# Patient Record
Sex: Female | Born: 1990 | Race: Black or African American | Hispanic: No | Marital: Single | Smoking: Current some day smoker
Health system: Southern US, Community
[De-identification: ages and names within clinical notes are randomized; demographics above are authoritative.]

## PROBLEM LIST (undated history)

## (undated) ENCOUNTER — Ambulatory Visit (HOSPITAL_COMMUNITY): Admission: EM | Payer: Self-pay | Source: Home / Self Care

## (undated) DIAGNOSIS — F32A Depression, unspecified: Secondary | ICD-10-CM

## (undated) DIAGNOSIS — F329 Major depressive disorder, single episode, unspecified: Secondary | ICD-10-CM

---
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
## 2016-02-14 ENCOUNTER — Ambulatory Visit (HOSPITAL_COMMUNITY)
Admission: EM | Admit: 2016-02-14 | Discharge: 2016-02-14 | Disposition: A | Discharge status: Home / Self Care | Payer: Self-pay | Attending: Emergency Medicine | Admitting: Emergency Medicine

## 2016-02-14 ENCOUNTER — Encounter (HOSPITAL_COMMUNITY): Payer: Self-pay | Admitting: *Deleted

## 2016-02-14 DIAGNOSIS — Z4802 Encounter for removal of sutures: Secondary | ICD-10-CM

## 2016-02-14 NOTE — Discharge Instructions (Signed)
If you notice swelling, redness, drainage of pus, increased pain, red streaks, decreased function of your hand or thumb seek medical treatment promptly. May take ibuprofen for pain. Clean the wound with mild soap and water 3-4 times a day.
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
## 2016-02-14 NOTE — ED Triage Notes (Signed)
Patient had sutures placed to anterior aspect of right hand on the 29th. On exam no signs of infection noted, area is healing well. Patient reports the laceration to her hand was down to the bone. Patient with decreased sensation to anterior aspect of thumb, states that this occurred with injury. Spoke with patient regarding physical therapy excercises for her thumb and hand as she has decreased grip from no using and damage.

## 2016-02-14 NOTE — ED Notes (Signed)
6 sutures removed from hand, one suture removed from laceration to back. No signs of infection to back laceration, patient states that she had 3 sutures to the back (states she scratched 2 of them out). Patient tolerating procedure using easy spray to numb site.

## 2017-02-23 ENCOUNTER — Emergency Department (HOSPITAL_COMMUNITY)
Admission: EM | Admit: 2017-02-23 | Discharge: 2017-02-23 | Disposition: A | Discharge status: Home / Self Care | Payer: Self-pay | Attending: Emergency Medicine | Admitting: Emergency Medicine

## 2017-02-23 ENCOUNTER — Encounter (HOSPITAL_COMMUNITY): Payer: Self-pay | Admitting: *Deleted

## 2017-02-23 DIAGNOSIS — N898 Other specified noninflammatory disorders of vagina: Secondary | ICD-10-CM

## 2017-02-23 DIAGNOSIS — B9689 Other specified bacterial agents as the cause of diseases classified elsewhere: Secondary | ICD-10-CM | POA: Insufficient documentation

## 2017-02-23 DIAGNOSIS — N760 Acute vaginitis: Secondary | ICD-10-CM | POA: Insufficient documentation

## 2017-02-23 DIAGNOSIS — F17200 Nicotine dependence, unspecified, uncomplicated: Secondary | ICD-10-CM | POA: Insufficient documentation

## 2017-02-23 LAB — WET PREP, GENITAL
Clue Cells Wet Prep HPF POC: NONE SEEN
Sperm: NONE SEEN
Trich, Wet Prep: NONE SEEN
Yeast Wet Prep HPF POC: NONE SEEN

## 2017-02-23 MED ORDER — METRONIDAZOLE 500 MG PO TABS: 500.0000 mg | ORAL_TABLET | Freq: Two times a day (BID) | ORAL | 0 refills | Status: DC

## 2017-02-23 NOTE — ED Notes (Signed)
Declined W/C at D/C and was escorted to lobby by RN. 

## 2017-02-23 NOTE — ED Provider Notes (Signed)
[facility] EMERGENCY DEPARTMENT Provider Note   CSN: [id number] Arrival date & time: 02/23/17  0818     History   Chief Complaint Chief Complaint  Patient presents with  . Vaginal Discharge    HPI April Bean is a 26 y.o. female.  She presents for evaluation of a malodorous vaginal discharge, present for several days.  She tried an over-the-counter yeast preparation, without relief.  She has some upper abdominal cramping but no lower abdomen or back pain.  She denies fever, chills, nausea, vomiting, weakness or dizziness.  There are no other known modifying factors.  HPI  History reviewed. No pertinent past medical history.  There are no active problems to display for this patient.   History reviewed. No pertinent surgical history.  OB History    No data available       Home Medications    Prior to Admission medications   Medication Sig Start Date End Date Taking? Authorizing Provider  ibuprofen (ADVIL,MOTRIN) 800 MG tablet Take 800 mg by mouth every 8 (eight) hours as needed.    [provider]  metroNIDAZOLE (FLAGYL) 500 MG tablet Take 1 tablet (500 mg total) by mouth 2 (two) times daily. One po bid x 7 days 02/23/17   Mancel BaleWentz, Shamirah Ivan, MD    Family History No family history on file.  Social History Social History  Substance Use Topics  . Smoking status: Current Every Day Smoker  . Smokeless tobacco: Never Used  . Alcohol use Not on file     Allergies   Pertussis vaccines   Review of Systems Review of Systems  All other systems reviewed and are negative.    Physical Exam Updated Vital Signs BP (!) 148/88 (BP Location: Right Arm)   Pulse 90   Temp 98.1 F (36.7 C) (Oral)   Ht 5' 7.25" (1.708 m)   Wt 89.4 kg (197 lb)   LMP 01/25/2017   SpO2 100%   BMI 30.63 kg/m   Physical Exam  Constitutional: She is oriented to person, place, and time. She appears well-developed and well-nourished. No distress.  HENT:    Head: Normocephalic and atraumatic.  Eyes: Pupils are equal, round, and reactive to light. Conjunctivae and EOM are normal.  Neck: Normal range of motion and phonation normal. Neck supple.  Cardiovascular: Normal rate.   Pulmonary/Chest: Effort normal. She exhibits no tenderness.  Abdominal: Soft. She exhibits no distension. There is no tenderness. There is no guarding.  Genitourinary:  Genitourinary Comments: Normal external female genitalia.  Small amount of thin opaque white vaginal discharge.  No cervical motion tenderness.  No adnexal mass or palpable organomegaly, on bimanual examination.  Musculoskeletal: Normal range of motion.  Neurological: She is alert and oriented to person, place, and time. She exhibits normal muscle tone.  Skin: Skin is warm and dry.  Psychiatric: She has a normal mood and affect. Her behavior is normal. Judgment and thought content normal.  Nursing note and vitals reviewed.    ED Treatments / Results  Labs (all labs ordered are listed, but only abnormal results are displayed) Labs Reviewed  WET PREP, GENITAL - Abnormal; Notable for the following:       Result Value   WBC, Wet Prep HPF POC FEW (*)    All other components within normal limits  GC/CHLAMYDIA PROBE AMP ([place]) NOT AT [facility]    EKG  EKG Interpretation None       Radiology No results found.  Procedures Procedures (including  critical care time)  Medications Ordered in ED Medications - No data to display   Initial Impression / Assessment and Plan / ED Course  I have reviewed the triage vital signs and the nursing notes.  Pertinent labs & imaging results that were available during my care of the patient were reviewed by me and considered in my medical decision making (see chart for details).      No data found.   At discharge- reevaluation with update and discussion. After initial assessment and treatment, an updated evaluation reveals no further complaints.  Findings  discussed with the patient and all questions were answered. Kimmberly Wisser L     Final Clinical Impressions(s) / ED Diagnoses   Final diagnoses:  Vaginal discharge  BV (bacterial vaginosis)   Nonspecific malodorous vaginal discharge, likely nonspecific vaginitis.  Doubt cervicitis, PID, serious bacterial infection or metabolic instability.  Nursing Notes Reviewed/ Care Coordinated Applicable Imaging Reviewed Interpretation of Laboratory Data incorporated into ED treatment  The patient appears reasonably screened and/or stabilized for discharge and I doubt any other medical condition or other [facility] requiring further screening, evaluation, or treatment in the ED at this time prior to discharge.  Plan: Home Medications-OTC analgesia as needed, moisturizing cream for skin dryness.; Home Treatments-rest; return here if the recommended treatment, does not improve the symptoms; Recommended follow up-PCP, as needed   New Prescriptions Discharge Medication List as of 02/23/2017 11:04 AM    START taking these medications   Details  metroNIDAZOLE (FLAGYL) 500 MG tablet Take 1 tablet (500 mg total) by mouth 2 (two) times daily. One po bid x 7 days, Starting Fri 02/23/2017, Print         Mancel Bale, MD 02/24/17 [phone]

## 2017-02-23 NOTE — Discharge Instructions (Signed)
Follow-up with 1 of the listed clinics, if you are not better in 1 week.

## 2017-02-23 NOTE — ED Triage Notes (Signed)
Pt states at she has had yellow vaginal discharge for 1 week reports using OTC yeast infection cream with no relief.

## 2017-02-26 LAB — GC/CHLAMYDIA PROBE AMP (~~LOC~~) NOT AT ARMC
Chlamydia: NEGATIVE
Neisseria Gonorrhea: NEGATIVE

## 2017-06-11 ENCOUNTER — Encounter (HOSPITAL_COMMUNITY): Payer: Self-pay

## 2017-06-11 ENCOUNTER — Other Ambulatory Visit: Payer: Self-pay

## 2017-06-11 ENCOUNTER — Emergency Department (HOSPITAL_COMMUNITY)
Admission: EM | Admit: 2017-06-11 | Discharge: 2017-06-11 | Disposition: A | Discharge status: Home / Self Care | Payer: Self-pay | Attending: Emergency Medicine | Admitting: Emergency Medicine

## 2017-06-11 DIAGNOSIS — R51 Headache: Secondary | ICD-10-CM | POA: Insufficient documentation

## 2017-06-11 DIAGNOSIS — Z5321 Procedure and treatment not carried out due to patient leaving prior to being seen by health care provider: Secondary | ICD-10-CM | POA: Insufficient documentation

## 2017-06-11 DIAGNOSIS — N898 Other specified noninflammatory disorders of vagina: Secondary | ICD-10-CM | POA: Insufficient documentation

## 2017-06-11 LAB — POC URINE PREG, ED: PREG TEST UR: NEGATIVE

## 2017-06-11 NOTE — ED Triage Notes (Signed)
Pt presents to the ed with complaints of vaginal discharge x 4 days and a headache x 2 weeks, no neuro deficits present at triage.

## 2017-06-14 ENCOUNTER — Other Ambulatory Visit: Payer: Self-pay

## 2017-06-14 ENCOUNTER — Emergency Department (HOSPITAL_COMMUNITY)
Admission: EM | Admit: 2017-06-14 | Discharge: 2017-06-14 | Disposition: A | Discharge status: Home / Self Care | Payer: Self-pay | Attending: Emergency Medicine | Admitting: Emergency Medicine

## 2017-06-14 ENCOUNTER — Encounter (HOSPITAL_COMMUNITY): Payer: Self-pay | Admitting: Emergency Medicine

## 2017-06-14 DIAGNOSIS — F17200 Nicotine dependence, unspecified, uncomplicated: Secondary | ICD-10-CM | POA: Insufficient documentation

## 2017-06-14 DIAGNOSIS — B9689 Other specified bacterial agents as the cause of diseases classified elsewhere: Secondary | ICD-10-CM | POA: Insufficient documentation

## 2017-06-14 DIAGNOSIS — N760 Acute vaginitis: Secondary | ICD-10-CM | POA: Insufficient documentation

## 2017-06-14 LAB — URINALYSIS, ROUTINE W REFLEX MICROSCOPIC
Bilirubin Urine: NEGATIVE
Glucose, UA: NEGATIVE mg/dL
Hgb urine dipstick: NEGATIVE
KETONES UR: NEGATIVE mg/dL
LEUKOCYTES UA: NEGATIVE
NITRITE: NEGATIVE
PROTEIN: NEGATIVE mg/dL
Specific Gravity, Urine: 1.018 (ref 1.005–1.030)
pH: 6 (ref 5.0–8.0)

## 2017-06-14 LAB — WET PREP, GENITAL
Sperm: NONE SEEN
TRICH WET PREP: NONE SEEN
YEAST WET PREP: NONE SEEN

## 2017-06-14 LAB — I-STAT BETA HCG BLOOD, ED (MC, WL, AP ONLY): I-stat hCG, quantitative: <5 m[IU]/mL (ref <5)

## 2017-06-14 MED ORDER — METRONIDAZOLE 500 MG PO TABS: 500.0000 mg | ORAL_TABLET | Freq: Two times a day (BID) | ORAL | 0 refills | Status: DC

## 2017-06-14 NOTE — ED Triage Notes (Signed)
Pt states she is having some vaginal discharge that she wants to be check.

## 2017-06-14 NOTE — Discharge Instructions (Signed)
You have evidence of bacterial vaginosis on the wet prep.  Additional testing, including STD test, are still pending.  Abnormal results will be called to you. Please take all of your antibiotics until finished!   You may develop abdominal discomfort or diarrhea from the antibiotic.  You may help offset this with probiotics which you can buy or get in yogurt. Do not eat or take the probiotics until 2 hours after your antibiotic.  Follow-up with OB/GYN or primary care provider for any further instances of this issue.

## 2017-06-14 NOTE — ED Provider Notes (Signed)
[facility] EMERGENCY DEPARTMENT Provider Note   CSN: [id number] Arrival date & time: 06/14/17  2956     History   Chief Complaint Chief Complaint  Patient presents with  . Vaginal Discharge    HPI April Bean is a 27 y.o. female.  HPI   April Bean is a 27 y.o. female, patient with no pertinent past medical history, presenting to the ED with abnormal vaginal discharge for the past week. Yellow and malodorous. LMP 05/25/17. States she was trying to get pregnant last month. Sexually active with one female partner. Denies fever/chills, dysuria, hematuria, abnormal vaginal bleeding, abdominal pain, N/V/D, vaginal pain/irritation, or any other complaints.     No past medical history on file.  There are no active problems to display for this patient.   History reviewed. No pertinent surgical history.  OB History    No data available       Home Medications    Prior to Admission medications   Medication Sig Start Date End Date Taking? Authorizing Provider  acetaminophen (TYLENOL) 500 MG tablet Take 1,000-1,500 mg by mouth every 6 (six) hours as needed for headache (pain).    [provider]  ibuprofen (ADVIL,MOTRIN) 200 MG tablet Take 400-600 mg by mouth every 6 (six) hours as needed for headache (pain).    [provider]  metroNIDAZOLE (FLAGYL) 500 MG tablet Take 1 tablet (500 mg total) by mouth 2 (two) times daily. 06/14/17   Prentiss Polio, Hillard Danker, PA-C    Family History No family history on file.  Social History Social History   Tobacco Use  . Smoking status: Current Every Day Smoker  . Smokeless tobacco: Never Used  Substance Use Topics  . Alcohol use: Not on file  . Drug use: Not on file     Allergies   Pertussis vaccines   Review of Systems Review of Systems  Constitutional: Negative for chills and fever.  Respiratory: Negative for shortness of breath.   Cardiovascular: Negative for chest pain.  Gastrointestinal:  Negative for abdominal pain.  Genitourinary: Positive for vaginal discharge. Negative for dysuria, frequency, hematuria, vaginal bleeding and vaginal pain.  All other systems reviewed and are negative.    Physical Exam Updated Vital Signs BP (!) 130/106 (BP Location: Right Arm)   Pulse (!) 53   Temp 98 F (36.7 C) (Oral)   Resp 18   Ht 5\' 7"  (1.702 m)   Wt 93.4 kg (206 lb)   LMP 05/28/2017   SpO2 100%   BMI 32.26 kg/m   Physical Exam  Constitutional: She appears well-developed and well-nourished. No distress.  HENT:  Head: Normocephalic and atraumatic.  Eyes: Conjunctivae are normal.  Neck: Neck supple.  Cardiovascular: Normal rate, regular rhythm, normal heart sounds and intact distal pulses.  Pulmonary/Chest: Effort normal and breath sounds normal. No respiratory distress.  Abdominal: Soft. There is no tenderness. There is no guarding.  Genitourinary: Vaginal discharge found.  Genitourinary Comments: External genitalia normal Vagina with discharge - thin, white moderate discharge in vaginal vault. Cervix  normal negative for cervical motion tenderness Adnexa palpated, no masses, negative for tenderness noted Bladder palpated negative for tenderness Uterus palpated no masses, negative for tenderness  No inguinal lymphadenopathy. Otherwise normal female genitalia. RN, Maralyn Sago, served as chaperone during exam.  Musculoskeletal: She exhibits no edema.  Lymphadenopathy:    She has no cervical adenopathy.  Neurological: She is alert.  Skin: Skin is warm and dry. She is not diaphoretic.  Psychiatric: She has  a normal mood and affect. Her behavior is normal.  Nursing note and vitals reviewed.    ED Treatments / Results  Labs (all labs ordered are listed, but only abnormal results are displayed) Labs Reviewed  WET PREP, GENITAL - Abnormal; Notable for the following components:      Result Value   Clue Cells Wet Prep HPF POC PRESENT (*)    WBC, Wet Prep HPF POC MANY (*)     All other components within normal limits  URINALYSIS, ROUTINE W REFLEX MICROSCOPIC  RPR  HIV ANTIBODY (ROUTINE TESTING)  I-STAT BETA HCG BLOOD, ED ([facility], [facility], [facility] ONLY)  GC/CHLAMYDIA PROBE AMP ([place]) NOT AT [facility]    EKG  EKG Interpretation None       Radiology No results found.  Procedures Pelvic exam Date/Time: 06/14/2017 9:30 AM Performed by: Anselm PancoastJoy, Fredderick Swanger C, PA-C Authorized by: Anselm PancoastJoy, Christophere Hillhouse C, PA-C  Consent: Verbal consent obtained. Risks and benefits: risks, benefits and alternatives were discussed Consent given by: patient Patient identity confirmed: verbally with patient and provided demographic data Local anesthesia used: no  Anesthesia: Local anesthesia used: no  Sedation: Patient sedated: no  Patient tolerance: Patient tolerated the procedure well with no immediate complications    (including critical care time)  Medications Ordered in ED Medications - No data to display   Initial Impression / Assessment and Plan / ED Course  I have reviewed the triage vital signs and the nursing notes.  Pertinent labs & imaging results that were available during my care of the patient were reviewed by me and considered in my medical decision making (see chart for details).     Patient presents with vaginal discharge without irritation or pain.  Low suspicion for PID.  Wet prep indicates BV.  Treatment initiated.  Advised OB/GYN follow-up for any further management. The patient was given instructions for home care as well as return precautions. Patient voices understanding of these instructions, accepts the plan, and is comfortable with discharge.    Final Clinical Impressions(s) / ED Diagnoses   Final diagnoses:  BV (bacterial vaginosis)    ED Discharge Orders        Ordered    metroNIDAZOLE (FLAGYL) 500 MG tablet  2 times daily     06/14/17 1101       Concepcion LivingJoy, Elgie Landino C, PA-C 06/14/17 1218    Tilden Fossaees, Elizabeth, MD 06/15/17 [phone]

## 2017-06-15 LAB — RPR: RPR: NONREACTIVE

## 2017-06-15 LAB — GC/CHLAMYDIA PROBE AMP (~~LOC~~) NOT AT ARMC
Chlamydia: NEGATIVE
NEISSERIA GONORRHEA: NEGATIVE

## 2017-06-15 LAB — HIV ANTIBODY (ROUTINE TESTING W REFLEX): HIV Screen 4th Generation wRfx: NONREACTIVE

## 2017-09-08 ENCOUNTER — Encounter (HOSPITAL_COMMUNITY): Payer: Self-pay | Admitting: Emergency Medicine

## 2017-09-08 ENCOUNTER — Emergency Department (HOSPITAL_COMMUNITY)
Admission: EM | Admit: 2017-09-08 | Discharge: 2017-09-08 | Disposition: A | Discharge status: Home / Self Care | Payer: Self-pay | Attending: Emergency Medicine | Admitting: Emergency Medicine

## 2017-09-08 ENCOUNTER — Other Ambulatory Visit: Payer: Self-pay

## 2017-09-08 DIAGNOSIS — K0889 Other specified disorders of teeth and supporting structures: Secondary | ICD-10-CM

## 2017-09-08 DIAGNOSIS — F17200 Nicotine dependence, unspecified, uncomplicated: Secondary | ICD-10-CM | POA: Insufficient documentation

## 2017-09-08 DIAGNOSIS — N898 Other specified noninflammatory disorders of vagina: Secondary | ICD-10-CM | POA: Insufficient documentation

## 2017-09-08 LAB — WET PREP, GENITAL
Clue Cells Wet Prep HPF POC: NONE SEEN
Sperm: NONE SEEN
Trich, Wet Prep: NONE SEEN
Yeast Wet Prep HPF POC: NONE SEEN

## 2017-09-08 LAB — URINALYSIS, COMPLETE (UACMP) WITH MICROSCOPIC
BILIRUBIN URINE: NEGATIVE
Bacteria, UA: NONE SEEN
GLUCOSE, UA: NEGATIVE mg/dL
HGB URINE DIPSTICK: NEGATIVE
Ketones, ur: NEGATIVE mg/dL
Leukocytes, UA: NEGATIVE
NITRITE: NEGATIVE
PH: 5 (ref 5.0–8.0)
Protein, ur: NEGATIVE mg/dL
SPECIFIC GRAVITY, URINE: 1.026 (ref 1.005–1.030)

## 2017-09-08 LAB — POC URINE PREG, ED: Preg Test, Ur: NEGATIVE

## 2017-09-08 MED ORDER — AZITHROMYCIN 250 MG PO TABS
1000.0000 mg | ORAL_TABLET | Freq: Once | ORAL | Status: AC
  Administered 2017-09-08: 1000 mg via ORAL
  Filled 2017-09-08: qty 4

## 2017-09-08 MED ORDER — STERILE WATER FOR INJECTION IJ SOLN: 0.9000 mL | Freq: Once | INTRAMUSCULAR | Status: DC

## 2017-09-08 MED ORDER — CEFTRIAXONE SODIUM 250 MG IJ SOLR
250.0000 mg | Freq: Once | INTRAMUSCULAR | Status: AC
  Administered 2017-09-08: 250 mg via INTRAMUSCULAR
  Filled 2017-09-08: qty 250

## 2017-09-08 MED ORDER — STERILE WATER FOR INJECTION IJ SOLN
10.0000 mL | Freq: Once | INTRAMUSCULAR | Status: AC
Start: 2017-09-08 — End: 2017-09-08
  Administered 2017-09-08: 0.9 mL via INTRAMUSCULAR
  Filled 2017-09-08: qty 10

## 2017-09-08 MED ORDER — IBUPROFEN 600 MG PO TABS: 600.0000 mg | ORAL_TABLET | Freq: Four times a day (QID) | ORAL | 0 refills | Status: DC | PRN

## 2017-09-08 MED ORDER — ACETAMINOPHEN 500 MG PO TABS: 500.0000 mg | ORAL_TABLET | Freq: Four times a day (QID) | ORAL | 0 refills | Status: DC | PRN

## 2017-09-08 MED ORDER — PENICILLIN V POTASSIUM 500 MG PO TABS: 500.0000 mg | ORAL_TABLET | Freq: Four times a day (QID) | ORAL | 0 refills | Status: AC

## 2017-09-08 MED ORDER — FLUCONAZOLE 150 MG PO TABS: 150.0000 mg | ORAL_TABLET | Freq: Every day | ORAL | 0 refills | Status: AC

## 2017-09-08 MED ORDER — IBUPROFEN 400 MG PO TABS
600.0000 mg | ORAL_TABLET | Freq: Once | ORAL | Status: AC
  Administered 2017-09-08: 600 mg via ORAL
  Filled 2017-09-08: qty 1

## 2017-09-08 NOTE — Discharge Instructions (Addendum)
Medications: penicillin, ibuprofen, Tylenol, Diflucan  Treatment: Take Penicillin 4 times daily until completed. Take Ibuprofen and Tylenol alternating every 3 hours, or choose one type every 6 hours. Take Diflucan if you develop symptoms of yeast infection and repeat in 72 hours if not resolved. You will be called in 3 days if you test positive for and sexually transmitted infections. If positive for HIV or Syphillis, please go to the health department for treatment. You have already been treated for gonorrhea and chlamydia today. Make sure your sexual partners are treated as well. Make sure to abstain from any sexual intercourse until all parties have been treated for 1 week and are without symptoms.  Follow-up: Please return to the emergency department if you develop any new or worsening symptoms. Please follow up with a dentist for further evaluation and treatment of your dental pain.

## 2017-09-08 NOTE — ED Provider Notes (Signed)
[facility] EMERGENCY DEPARTMENT Provider Note   CSN: [id number] Arrival date & time: 09/08/17  0115     History   Chief Complaint Chief Complaint  Patient presents with  . Dental Pain  . Vaginal Discharge    HPI April Bean is a 27 y.o. female who presents with a 2 to 84-month history of intermittent right lower dental pain.  Patient reports she has had an abscess in the past which she has popped several times, however keeps coming back.  She is not taking any medications at home except Tylenol.  She reports taking antibiotics a long time ago, but nothing recently.  She has not seen a dentist.  Patient also reports a one-week history of yellow/green vaginal discharge.  She has had some intermittent lower abdominal cramping, but no abdominal pain.  Patient gets irregular periods.  LMP 08/15/2017.  Patient denies any urinary symptoms.  Patient is sexually active with both female and female partner.  She has possible concern for STD exposure.  She is not using birth control because she is trying to get pregnant.  HPI  History reviewed. No pertinent past medical history.  There are no active problems to display for this patient.   History reviewed. No pertinent surgical history.   OB History   None      Home Medications    Prior to Admission medications   Medication Sig Start Date End Date Taking? Authorizing Provider  acetaminophen (TYLENOL) 500 MG tablet Take 1 tablet (500 mg total) by mouth every 6 (six) hours as needed. 09/08/17   Desiree Daise, Waylan Boga, PA-C  fluconazole (DIFLUCAN) 150 MG tablet Take 1 tablet (150 mg total) by mouth daily for 2 doses. If you develop symptoms, take 1 tablet and repeat in 72 hours if not resolved. 09/08/17 09/10/17  Meiko Ives, Waylan Boga, PA-C  ibuprofen (ADVIL,MOTRIN) 600 MG tablet Take 1 tablet (600 mg total) by mouth every 6 (six) hours as needed. 09/08/17   Kaira Stringfield, Waylan Boga, PA-C  metroNIDAZOLE (FLAGYL) 500 MG tablet Take 1 tablet (500  mg total) by mouth 2 (two) times daily. 06/14/17   Joy, Shawn C, PA-C  penicillin v potassium (VEETID) 500 MG tablet Take 1 tablet (500 mg total) by mouth 4 (four) times daily for 7 days. 09/08/17 09/15/17  Emi Holes, PA-C    Family History No family history on file.  Social History Social History   Tobacco Use  . Smoking status: Current Every Day Smoker  . Smokeless tobacco: Never Used  Substance Use Topics  . Alcohol use: Yes  . Drug use: Never     Allergies   Pertussis vaccines   Review of Systems Review of Systems  Constitutional: Negative for chills and fever.  HENT: Positive for dental problem. Negative for facial swelling and sore throat.   Respiratory: Negative for shortness of breath.   Cardiovascular: Negative for chest pain.  Gastrointestinal: Negative for abdominal pain, nausea and vomiting.  Genitourinary: Positive for vaginal discharge. Negative for dysuria and vaginal bleeding.  Musculoskeletal: Negative for back pain.  Skin: Negative for rash and wound.  Neurological: Negative for headaches.  Psychiatric/Behavioral: The patient is not nervous/anxious.      Physical Exam Updated Vital Signs BP 116/75 (BP Location: Left Arm)   Pulse (!) 58   Temp 98.6 F (37 C) (Oral)   Resp 16   LMP 08/15/2017   SpO2 100%   Physical Exam  Constitutional: She appears well-developed and well-nourished. No distress.  HENT:  Head: Normocephalic and atraumatic.  Mouth/Throat: Oropharynx is clear and moist. No oropharyngeal exudate.    No tenderness or masses of the floor the mouth, no submandibular mass  Eyes: Pupils are equal, round, and reactive to light. Conjunctivae are normal. Right eye exhibits no discharge. Left eye exhibits no discharge. No scleral icterus.  Neck: Normal range of motion. Neck supple. No thyromegaly present.  Cardiovascular: Normal rate, regular rhythm, normal heart sounds and intact distal pulses. Exam reveals no gallop and no friction  rub.  No murmur heard. Pulmonary/Chest: Effort normal and breath sounds normal. No stridor. No respiratory distress. She has no wheezes. She has no rales.  Abdominal: Soft. Bowel sounds are normal. She exhibits no distension. There is no tenderness. There is no rebound, no guarding and no CVA tenderness.  Genitourinary: Pelvic exam was performed with patient supine. Cervix exhibits discharge. Cervix exhibits no motion tenderness. Right adnexum displays no tenderness. Left adnexum displays no tenderness. Vaginal discharge (milky, white) found.  Musculoskeletal: She exhibits no edema.  Lymphadenopathy:    She has no cervical adenopathy.  Neurological: She is alert. Coordination normal.  Skin: Skin is warm and dry. No rash noted. She is not diaphoretic. No pallor.  Psychiatric: She has a normal mood and affect.  Nursing note and vitals reviewed.    ED Treatments / Results  Labs (all labs ordered are listed, but only abnormal results are displayed) Labs Reviewed  WET PREP, GENITAL - Abnormal; Notable for the following components:      Result Value   WBC, Wet Prep HPF POC FEW (*)    All other components within normal limits  URINALYSIS, COMPLETE (UACMP) WITH MICROSCOPIC - Abnormal; Notable for the following components:   APPearance HAZY (*)    All other components within normal limits  RPR  HIV ANTIBODY (ROUTINE TESTING)  POC URINE PREG, ED  GC/CHLAMYDIA PROBE AMP ([place]) NOT AT [facility]    EKG None  Radiology No results found.  Procedures Procedures (including critical care time)  Medications Ordered in ED Medications  cefTRIAXone (ROCEPHIN) injection 250 mg (has no administration in time range)  azithromycin (ZITHROMAX) tablet 1,000 mg (has no administration in time range)  sterile water (preservative free) injection 0.9 mL (has no administration in time range)  ibuprofen (ADVIL,MOTRIN) tablet 600 mg (600 mg Oral Given 09/08/17 1610)     Initial Impression / Assessment  and Plan / ED Course  I have reviewed the triage vital signs and the nursing notes.  Pertinent labs & imaging results that were available during my care of the patient were reviewed by me and considered in my medical decision making (see chart for details).     Patient presenting with acute on chronic dental pain and one-week history of vaginal discharge.  Patient has concern for STD exposure.  Wet prep shows few WBCs, however milky, white discharge on exam.  Will treat prophylactically for GC/chlamydia.  No concern for PID.  GC/chlamydia, HIV, RPR sent and pending.  She is advised to follow-up with for treatment if positive for HIV or syphilis at the health department.  UA is negative.  Urine pregnancy negative.  We will also discharge home with penicillin for dental infection.  Patient requests Diflucan, as she states she gets yeast infections when she takes antibiotics. Patient advised to follow-up with dentist as soon as possible.  Return precautions discussed.  Patient understands and agrees with plan.  Patient vitals stable throughout ED course and discharged in satisfactory  condition.  Final Clinical Impressions(s) / ED Diagnoses   Final diagnoses:  Vaginal discharge  Pain, dental    ED Discharge Orders        Ordered    penicillin v potassium (VEETID) 500 MG tablet  4 times daily     09/08/17 1109    ibuprofen (ADVIL,MOTRIN) 600 MG tablet  Every 6 hours PRN     09/08/17 1109    acetaminophen (TYLENOL) 500 MG tablet  Every 6 hours PRN     09/08/17 1109    fluconazole (DIFLUCAN) 150 MG tablet  Daily     09/08/17 [address] PA-C 09/08/17 1123    Jacalyn Lefevre, MD 09/08/17 1310

## 2017-09-08 NOTE — ED Triage Notes (Signed)
C/o abscess to right lower mouth x 2-3 months.  States she has popped it several times and it comes back.  Also reports vaginal itching, burning, and yellow discharge x 1 week.

## 2017-09-09 LAB — RPR: RPR Ser Ql: NONREACTIVE

## 2017-09-09 LAB — HIV ANTIBODY (ROUTINE TESTING W REFLEX): HIV Screen 4th Generation wRfx: NONREACTIVE

## 2017-09-10 LAB — GC/CHLAMYDIA PROBE AMP (~~LOC~~) NOT AT ARMC
Chlamydia: NEGATIVE
Neisseria Gonorrhea: NEGATIVE

## 2018-04-10 ENCOUNTER — Other Ambulatory Visit: Payer: Self-pay

## 2018-04-10 ENCOUNTER — Emergency Department (HOSPITAL_COMMUNITY)
Admission: EM | Admit: 2018-04-10 | Discharge: 2018-04-10 | Disposition: A | Discharge status: Home / Self Care | Payer: Self-pay | Attending: Emergency Medicine | Admitting: Emergency Medicine

## 2018-04-10 ENCOUNTER — Encounter (HOSPITAL_COMMUNITY): Payer: Self-pay | Admitting: *Deleted

## 2018-04-10 DIAGNOSIS — F17200 Nicotine dependence, unspecified, uncomplicated: Secondary | ICD-10-CM | POA: Insufficient documentation

## 2018-04-10 DIAGNOSIS — N760 Acute vaginitis: Secondary | ICD-10-CM | POA: Insufficient documentation

## 2018-04-10 LAB — WET PREP, GENITAL
Sperm: NONE SEEN
Trich, Wet Prep: NONE SEEN

## 2018-04-10 MED ORDER — METRONIDAZOLE 500 MG PO TABS: 500.0000 mg | ORAL_TABLET | Freq: Two times a day (BID) | ORAL | 0 refills | Status: DC

## 2018-04-10 MED ORDER — FLUCONAZOLE 150 MG PO TABS: 150.0000 mg | ORAL_TABLET | Freq: Every day | ORAL | 0 refills | Status: AC

## 2018-04-10 MED ORDER — DIMENHYDRINATE 50 MG PO TABS: 50.0000 mg | ORAL_TABLET | Freq: Three times a day (TID) | ORAL | 0 refills | Status: DC | PRN

## 2018-04-10 NOTE — ED Provider Notes (Signed)
[facility] Provider Note   CSN: [id number] Arrival date & time: 04/10/18  1118     History   Chief Complaint Chief Complaint  Patient presents with  . Vaginal Discharge    HPI April Bean is a 27 y.o. female.  HPI  27 year old female presents today with vaginal discharge.  She notes a 3-day history of yellow vaginal discharge and itching.  She denies any abdominal pain, fever nausea or vomiting.  He notes she is sexually active with one female partner.  No recent antibiotics.  Patient notes she is going on a cruise and would like antinausea medication for this.  History reviewed. No pertinent past medical history.  There are no active problems to display for this patient.   History reviewed. No pertinent surgical history.   OB History   None      Home Medications    Prior to Admission medications   Medication Sig Start Date End Date Taking? Authorizing Provider  acetaminophen (TYLENOL) 500 MG tablet Take 1 tablet (500 mg total) by mouth every 6 (six) hours as needed. 09/08/17   Law, Waylan BogaAlexandra M, PA-C  dimenhyDRINATE (DRAMAMINE) 50 MG tablet Take 1 tablet (50 mg total) by mouth every 8 (eight) hours as needed. 04/10/18   Bertran Zeimet, Tinnie GensJeffrey, PA-C  fluconazole (DIFLUCAN) 150 MG tablet Take 1 tablet (150 mg total) by mouth daily for 1 day. 04/10/18 04/11/18  Bradan Congrove, Tinnie GensJeffrey, PA-C  ibuprofen (ADVIL,MOTRIN) 600 MG tablet Take 1 tablet (600 mg total) by mouth every 6 (six) hours as needed. 09/08/17   Law, Waylan BogaAlexandra M, PA-C  metroNIDAZOLE (FLAGYL) 500 MG tablet Take 1 tablet (500 mg total) by mouth 2 (two) times daily. 04/10/18   Eyvonne MechanicHedges, Alok Minshall, PA-C    Family History History reviewed. No pertinent family history.  Social History Social History   Tobacco Use  . Smoking status: Current Every Day Smoker    Packs/day: 0.50  . Smokeless tobacco: Never Used  Substance Use Topics  . Alcohol use: Yes    Comment: social  . Drug use:  Never     Allergies   Pertussis vaccines   Review of Systems Review of Systems  All other systems reviewed and are negative.   Physical Exam Updated Vital Signs BP 128/78 (BP Location: Right Arm)   Pulse 83   Temp 97.9 F (36.6 C) (Oral)   Resp 16   Ht 5\' 7"  (1.702 m)   LMP 03/30/2018   SpO2 100%   BMI 32.26 kg/m   Physical Exam  Constitutional: She is oriented to person, place, and time. She appears well-developed and well-nourished.  HENT:  Head: Normocephalic and atraumatic.  Eyes: Pupils are equal, round, and reactive to light. Conjunctivae are normal. Right eye exhibits no discharge. Left eye exhibits no discharge. No scleral icterus.  Neck: Normal range of motion. No JVD present. No tracheal deviation present.  Pulmonary/Chest: Effort normal. No stridor.  Genitourinary:  Genitourinary Comments: Purulent clumpy discharge noted in vaginal vault, no cervical motion tenderness-no external lesions  Neurological: She is alert and oriented to person, place, and time. Coordination normal.  Psychiatric: She has a normal mood and affect. Her behavior is normal. Judgment and thought content normal.  Nursing note and vitals reviewed.    ED Treatments / Results  Labs (all labs ordered are listed, but only abnormal results are displayed) Labs Reviewed  WET PREP, GENITAL - Abnormal; Notable for the following components:      Result Value  Yeast Wet Prep HPF POC PRESENT (*)    Clue Cells Wet Prep HPF POC PRESENT (*)    WBC, Wet Prep HPF POC MANY (*)    All other components within normal limits  GC/CHLAMYDIA PROBE AMP () NOT AT [facility]    EKG None  Radiology No results found.  Procedures Procedures (including critical care time)  Medications Ordered in ED Medications - No data to display   Initial Impression / Assessment and Plan / ED Course  I have reviewed the triage vital signs and the nursing notes.  Pertinent labs & imaging results that were  available during my care of the patient were reviewed by me and considered in my medical decision making (see chart for details).     27 year old female presents today with vaginal discharge.  Patient does have some purulence on my exam.  She has yeast and clue cells, she not wants any treatment for STDs she does not believe that this is an STD.  Patient will be treated for yeast and bacterial vaginosis.  She will return immediately if develops any new worsening signs or symptoms.  No signs of PID.  Discharged with return precautions.  Final Clinical Impressions(s) / ED Diagnoses   Final diagnoses:  Acute vaginitis    ED Discharge Orders         Ordered    metroNIDAZOLE (FLAGYL) 500 MG tablet  2 times daily,   Status:  Discontinued     04/10/18 1259    fluconazole (DIFLUCAN) 150 MG tablet  Daily     04/10/18 1259    dimenhyDRINATE (DRAMAMINE) 50 MG tablet  Every 8 hours PRN     04/10/18 1300    metroNIDAZOLE (FLAGYL) 500 MG tablet  2 times daily     04/10/18 1302           Eyvonne Mechanic, PA-C 04/10/18 1323    Benjiman Core, MD 04/10/18 1850

## 2018-04-10 NOTE — ED Triage Notes (Signed)
PT reports 3 days of yellow Vag. Discharge. Pt also reports vag. Itching.

## 2018-04-10 NOTE — Discharge Instructions (Addendum)
Please read the attached information.  Please do not drink while using these medications.  Return immediately with any new or worsening signs or symptoms.

## 2018-04-11 LAB — GC/CHLAMYDIA PROBE AMP (~~LOC~~) NOT AT ARMC
Chlamydia: NEGATIVE
Neisseria Gonorrhea: NEGATIVE

## 2018-06-27 ENCOUNTER — Encounter (HOSPITAL_COMMUNITY): Payer: Self-pay | Admitting: Emergency Medicine

## 2018-06-27 ENCOUNTER — Emergency Department (HOSPITAL_COMMUNITY)
Admission: EM | Admit: 2018-06-27 | Discharge: 2018-06-27 | Disposition: A | Discharge status: Home / Self Care | Payer: BLUE CROSS/BLUE SHIELD | Attending: Emergency Medicine | Admitting: Emergency Medicine

## 2018-06-27 DIAGNOSIS — F17200 Nicotine dependence, unspecified, uncomplicated: Secondary | ICD-10-CM | POA: Insufficient documentation

## 2018-06-27 DIAGNOSIS — B373 Candidiasis of vulva and vagina: Secondary | ICD-10-CM | POA: Insufficient documentation

## 2018-06-27 DIAGNOSIS — N898 Other specified noninflammatory disorders of vagina: Secondary | ICD-10-CM | POA: Diagnosis present

## 2018-06-27 DIAGNOSIS — B3731 Acute candidiasis of vulva and vagina: Secondary | ICD-10-CM

## 2018-06-27 LAB — URINALYSIS, ROUTINE W REFLEX MICROSCOPIC
BILIRUBIN URINE: NEGATIVE
Glucose, UA: NEGATIVE mg/dL
Hgb urine dipstick: NEGATIVE
KETONES UR: NEGATIVE mg/dL
Leukocytes,Ua: NEGATIVE
NITRITE: NEGATIVE
PROTEIN: NEGATIVE mg/dL
Specific Gravity, Urine: 1.018 (ref 1.005–1.030)
pH: 7 (ref 5.0–8.0)

## 2018-06-27 LAB — WET PREP, GENITAL
CLUE CELLS WET PREP: NONE SEEN
SPERM: NONE SEEN
TRICH WET PREP: NONE SEEN

## 2018-06-27 MED ORDER — FLUCONAZOLE 150 MG PO TABS: 150.0000 mg | ORAL_TABLET | Freq: Once | ORAL | 0 refills | Status: AC

## 2018-06-27 MED ORDER — FLUCONAZOLE 150 MG PO TABS
150.0000 mg | ORAL_TABLET | Freq: Once | ORAL | Status: AC
  Administered 2018-06-27: 150 mg via ORAL
  Filled 2018-06-27: qty 1

## 2018-06-27 NOTE — ED Triage Notes (Signed)
Pt concerned she may have yeast infection. Pt having yellow/green discharge. Denies itching/pain. Denies urinary symptoms

## 2018-06-27 NOTE — ED Notes (Signed)
Pt ambulated to bathroom independently

## 2018-06-27 NOTE — ED Notes (Signed)
Declined W/C at D/C and was escorted to lobby by RN. 

## 2018-06-27 NOTE — ED Provider Notes (Signed)
[facility] EMERGENCY DEPARTMENT Provider Note   CSN: [id number] Arrival date & time: 06/27/18  1027    History   Chief Complaint Chief Complaint  Patient presents with  . Vaginal Discharge    HPI April Bean is a 28 y.o. female.     28 y.o female with a PMH presents to the ED with a chief complaint of vaginal discharge x 3-4 days. Patient reports a green to yellow discharge for the past couple of days. She reports a previous history of yeast infection. She has not tried any therapy for relieve in symptoms.  She reports she is currently sexually active with females only, denies any chance of pregnancy at this time.  Denies any vaginal bleeding, abdominal pain, urinary symptoms.      OB History   No obstetric history on file.      Home Medications    Prior to Admission medications   Medication Sig Start Date End Date Taking? Authorizing Provider  acetaminophen (TYLENOL) 500 MG tablet Take 1 tablet (500 mg total) by mouth every 6 (six) hours as needed. 09/08/17   Law, Waylan Boga, PA-C  dimenhyDRINATE (DRAMAMINE) 50 MG tablet Take 1 tablet (50 mg total) by mouth every 8 (eight) hours as needed. 04/10/18   Hedges, Tinnie Gens, PA-C  fluconazole (DIFLUCAN) 150 MG tablet Take 1 tablet (150 mg total) by mouth once for 1 dose. 06/27/18 06/27/18  Claude Manges, PA-C  ibuprofen (ADVIL,MOTRIN) 600 MG tablet Take 1 tablet (600 mg total) by mouth every 6 (six) hours as needed. 09/08/17   Law, Waylan Boga, PA-C  metroNIDAZOLE (FLAGYL) 500 MG tablet Take 1 tablet (500 mg total) by mouth 2 (two) times daily. 04/10/18   Eyvonne Mechanic, PA-C    Family History History reviewed. No pertinent family history.  Social History Social History   Tobacco Use  . Smoking status: Current Every Day Smoker    Packs/day: 0.50  . Smokeless tobacco: Never Used  Substance Use Topics  . Alcohol use: Yes    Comment: social  . Drug use: Never     Allergies   Pertussis  vaccines   Review of Systems Review of Systems  Constitutional: Negative for fever.  Gastrointestinal: Negative for abdominal pain.  Genitourinary: Positive for vaginal discharge. Negative for vaginal bleeding and vaginal pain.     Physical Exam Updated Vital Signs BP 127/68 (BP Location: Right Arm)   Pulse 63   Temp 98.7 F (37.1 C) (Oral)   Resp 19   Ht 5\' 7"  (1.702 m)   Wt 88.5 kg   LMP 06/03/2018   SpO2 99%   BMI 30.54 kg/m   Physical Exam Vitals signs and nursing note reviewed. Exam conducted with a chaperone present.  Constitutional:      General: She is not in acute distress.    Appearance: She is well-developed.  HENT:     Head: Normocephalic and atraumatic.     Mouth/Throat:     Pharynx: No oropharyngeal exudate.  Eyes:     Pupils: Pupils are equal, round, and reactive to light.  Neck:     Musculoskeletal: Normal range of motion.  Cardiovascular:     Rate and Rhythm: Regular rhythm.     Heart sounds: Normal heart sounds.  Pulmonary:     Effort: Pulmonary effort is normal. No respiratory distress.     Breath sounds: Normal breath sounds.  Abdominal:     General: Bowel sounds are normal. There is no distension.  Palpations: Abdomen is soft.     Tenderness: There is no abdominal tenderness.  Genitourinary:    Exam position: Supine.     Labia:        Right: No rash or tenderness.        Left: No rash or tenderness.      Vagina: No vaginal discharge, erythema, tenderness or bleeding.     Cervix: Discharge present. No erythema.     Adnexa:        Right: No tenderness.         Left: No tenderness.       Comments: Aleska NT present. White/green discharge from cervical os. No CMT. No adnexa tenderness.  Musculoskeletal:        General: No tenderness or deformity.     Right lower leg: No edema.     Left lower leg: No edema.  Skin:    General: Skin is warm and dry.  Neurological:     Mental Status: She is alert and oriented to person, place, and  time.      ED Treatments / Results  Labs (all labs ordered are listed, but only abnormal results are displayed) Labs Reviewed  WET PREP, GENITAL - Abnormal; Notable for the following components:      Result Value   Yeast Wet Prep HPF POC PRESENT (*)    WBC, Wet Prep HPF POC MANY (*)    All other components within normal limits  URINALYSIS, ROUTINE W REFLEX MICROSCOPIC - Abnormal; Notable for the following components:   Color, Urine STRAW (*)    All other components within normal limits  GC/CHLAMYDIA PROBE AMP ([place]) NOT AT [facility]    EKG None  Radiology No results found.  Procedures Procedures (including critical care time)  Medications Ordered in ED Medications  fluconazole (DIFLUCAN) tablet 150 mg (has no administration in time range)     Initial Impression / Assessment and Plan / ED Course  I have reviewed the triage vital signs and the nursing notes.  Pertinent labs & imaging results that were available during my care of the patient were reviewed by me and considered in my medical decision making (see chart for details).       Patient with no past medical history, she is currently only sexually active with female partners.  Denies any previous history of STI, does report a previous history of yeast infection which is what she thinks she has.  During evaluation and pelvic exam patient has significant amount of white copious discharge white to green in color. Wet prep revealed many white blood cell counts, yeast was present, GC and Chlamydia results will be available by the end of next week, I have advised patient to check back.  Prophylactic treatment was offered to patient however she reports she does not think she needs this as she currently is not sexually active with men.  Urinalysis showed no nitrites, leukocytes, white blood cell count, afebrile during visit.  At this time will treat patient with Diflucan while in the ED along with sent her home with a  second dose of Diflucan in 3 days if symptoms do not improve.  I have discussed the risks and benefits of using this medication and how it can affect her liver.  She understands and agrees with management.  Return precautions provided.   Final Clinical Impressions(s) / ED Diagnoses   Final diagnoses:  Yeast vaginitis    ED Discharge Orders  Ordered    fluconazole (DIFLUCAN) 150 MG tablet   Once     06/27/18 1213           Claude Manges, [place] 06/27/18 1227    Tegeler, Canary Brim, MD 06/27/18 [phone]

## 2018-06-27 NOTE — Discharge Instructions (Signed)
Today you were treated for a yeast infection. Please know this medication gets processed by your liver, please refrain from any alcohol. We discussed if results do not get better in 3 days, I have prescribed a second dose. Please do not use if not needed.

## 2018-06-28 LAB — GC/CHLAMYDIA PROBE AMP (~~LOC~~) NOT AT ARMC
Chlamydia: NEGATIVE
NEISSERIA GONORRHEA: NEGATIVE

## 2018-12-06 ENCOUNTER — Emergency Department (HOSPITAL_COMMUNITY)
Admission: EM | Admit: 2018-12-06 | Discharge: 2018-12-06 | Disposition: A | Discharge status: Home / Self Care | Payer: BLUE CROSS/BLUE SHIELD | Attending: Emergency Medicine | Admitting: Emergency Medicine

## 2018-12-06 ENCOUNTER — Encounter (HOSPITAL_COMMUNITY): Payer: Self-pay | Admitting: Emergency Medicine

## 2018-12-06 ENCOUNTER — Other Ambulatory Visit: Payer: Self-pay

## 2018-12-06 DIAGNOSIS — B3731 Acute candidiasis of vulva and vagina: Secondary | ICD-10-CM

## 2018-12-06 DIAGNOSIS — B373 Candidiasis of vulva and vagina: Secondary | ICD-10-CM | POA: Insufficient documentation

## 2018-12-06 DIAGNOSIS — R102 Pelvic and perineal pain: Secondary | ICD-10-CM | POA: Diagnosis present

## 2018-12-06 DIAGNOSIS — F17210 Nicotine dependence, cigarettes, uncomplicated: Secondary | ICD-10-CM | POA: Diagnosis not present

## 2018-12-06 HISTORY — DX: Depression, unspecified: F32.A

## 2018-12-06 LAB — URINALYSIS, ROUTINE W REFLEX MICROSCOPIC
Bacteria, UA: NONE SEEN
Bilirubin Urine: NEGATIVE
Glucose, UA: NEGATIVE mg/dL
Hgb urine dipstick: NEGATIVE
Ketones, ur: NEGATIVE mg/dL
Nitrite: NEGATIVE
Protein, ur: NEGATIVE mg/dL
Specific Gravity, Urine: 1.019 (ref 1.005–1.030)
pH: 6 (ref 5.0–8.0)

## 2018-12-06 LAB — PREGNANCY, URINE: Preg Test, Ur: NEGATIVE

## 2018-12-06 LAB — WET PREP, GENITAL
Clue Cells Wet Prep HPF POC: NONE SEEN
Sperm: NONE SEEN
Trich, Wet Prep: NONE SEEN

## 2018-12-06 MED ORDER — FLUCONAZOLE 150 MG PO TABS
150.0000 mg | ORAL_TABLET | Freq: Once | ORAL | Status: AC
  Administered 2018-12-06: 150 mg via ORAL
  Filled 2018-12-06: qty 1

## 2018-12-06 NOTE — ED Provider Notes (Signed)
[facility] Provider Note   CSN: [id number] Arrival date & time: 12/06/18  1058    History   Chief Complaint Chief Complaint  Patient presents with  . Vaginal Pain    HPI April Bean is a 28 y.o. female who presents emergency department chief complaint of vaginal discharge.  She states that she has current vaginal yeast infections.  She has thick curd-like vaginal discharge and itching.  It began about a week ago and has progressively worsened.  She is sexually active with a single partner.  She denies fevers or chills.  She denies pelvic pain, bleeding or urinary symptoms. She is not diabetic.  She has no recent antibiotic use.    HPI  Past Medical History:  Diagnosis Date  . Depression     There are no active problems to display for this patient.   History reviewed. No pertinent surgical history.   OB History   No obstetric history on file.      Home Medications    Prior to Admission medications   Medication Sig Start Date End Date Taking? Authorizing Provider  acetaminophen (TYLENOL) 500 MG tablet Take 1 tablet (500 mg total) by mouth every 6 (six) hours as needed. 09/08/17   Law, Waylan BogaAlexandra M, PA-C  dimenhyDRINATE (DRAMAMINE) 50 MG tablet Take 1 tablet (50 mg total) by mouth every 8 (eight) hours as needed. 04/10/18   Hedges, Tinnie GensJeffrey, PA-C  ibuprofen (ADVIL,MOTRIN) 600 MG tablet Take 1 tablet (600 mg total) by mouth every 6 (six) hours as needed. 09/08/17   Law, Waylan BogaAlexandra M, PA-C  metroNIDAZOLE (FLAGYL) 500 MG tablet Take 1 tablet (500 mg total) by mouth 2 (two) times daily. 04/10/18   Eyvonne MechanicHedges, Jeffrey, PA-C    Family History No family history on file.  Social History Social History   Tobacco Use  . Smoking status: Current Every Day Smoker    Packs/day: 0.50  . Smokeless tobacco: Current User  Substance Use Topics  . Alcohol use: Yes    Comment: social  . Drug use: Yes    Types: Marijuana     Allergies    Pertussis vaccines   Review of Systems Review of Systems Ten systems reviewed and are negative for acute change, except as noted in the HPI.    Physical Exam Updated Vital Signs BP 124/61 (BP Location: Right Arm)   Pulse 73   Temp 98.6 F (37 C) (Oral)   Resp 16   SpO2 99%   Physical Exam Vitals signs and nursing note reviewed.  Constitutional:      General: She is not in acute distress.    Appearance: She is well-developed. She is not diaphoretic.  HENT:     Head: Normocephalic and atraumatic.  Eyes:     General: No scleral icterus.    Conjunctiva/sclera: Conjunctivae normal.  Neck:     Musculoskeletal: Normal range of motion.  Cardiovascular:     Rate and Rhythm: Normal rate and regular rhythm.     Heart sounds: Normal heart sounds. No murmur. No friction rub. No gallop.   Pulmonary:     Effort: Pulmonary effort is normal. No respiratory distress.     Breath sounds: Normal breath sounds.  Abdominal:     General: Bowel sounds are normal. There is no distension.     Palpations: Abdomen is soft. There is no mass.     Tenderness: There is no abdominal tenderness. There is no guarding.  Skin:    General:  Skin is warm and dry.  Neurological:     Mental Status: She is alert and oriented to person, place, and time.  Psychiatric:        Behavior: Behavior normal.      ED Treatments / Results  Labs (all labs ordered are listed, but only abnormal results are displayed) Labs Reviewed  WET PREP, GENITAL - Abnormal; Notable for the following components:      Result Value   Yeast Wet Prep HPF POC PRESENT (*)    WBC, Wet Prep HPF POC MANY (*)    All other components within normal limits  URINALYSIS, ROUTINE W REFLEX MICROSCOPIC - Abnormal; Notable for the following components:   Leukocytes,Ua SMALL (*)    All other components within normal limits  PREGNANCY, URINE  GC/CHLAMYDIA PROBE AMP ([place]) NOT AT [facility]    EKG None  Radiology No results found.   Procedures Procedures (including critical care time)  Medications Ordered in ED Medications  fluconazole (DIFLUCAN) tablet 150 mg (has no administration in time range)     Initial Impression / Assessment and Plan / ED Course  I have reviewed the triage vital signs and the nursing notes.  Pertinent labs & imaging results that were available during my care of the patient were reviewed by me and considered in my medical decision making (see chart for details).        28 year old female here with recurrent vaginal yeast infection symptoms.  I personally reviewed her labs which show contaminated urine without evidence of infection, negative pregnancy test, wet prep positive for white blood cell count and a yeast seen.  Patient has no abdominal tenderness on my exam.  Given the fact that she is not having complaints of pain we opted for self swab.  The patient will follow up with OB/GYN.  She was given a dose of Diflucan here.  Discussed return precautions.  She was appropriate for discharge at this time.  Final Clinical Impressions(s) / ED Diagnoses   Final diagnoses:  Yeast vaginitis    ED Discharge Orders    None       Margarita Mail, PA-C 12/06/18 1314    Maudie Flakes, MD 12/09/18 [phone]

## 2018-12-06 NOTE — Discharge Instructions (Signed)
Contact a health care provider if:  You have a fever.  Your symptoms go away and then return.  Your symptoms do not get better with treatment.  Your symptoms get worse.  You have new symptoms.  You develop blisters in or around your vagina.  You have blood coming from your vagina and it is not your menstrual period.  You develop pain in your abdomen.

## 2018-12-06 NOTE — ED Triage Notes (Signed)
Vaginal pain and itching and d/c x 1 week

## 2018-12-07 LAB — GC/CHLAMYDIA PROBE AMP (~~LOC~~) NOT AT ARMC
Chlamydia: NEGATIVE
Neisseria Gonorrhea: NEGATIVE

## 2019-03-30 ENCOUNTER — Encounter (HOSPITAL_COMMUNITY): Payer: Self-pay | Admitting: Emergency Medicine

## 2019-03-30 ENCOUNTER — Other Ambulatory Visit: Payer: Self-pay

## 2019-03-30 ENCOUNTER — Emergency Department (HOSPITAL_COMMUNITY)
Admission: EM | Admit: 2019-03-30 | Discharge: 2019-03-30 | Disposition: A | Discharge status: Home / Self Care | Payer: BLUE CROSS/BLUE SHIELD | Attending: Emergency Medicine | Admitting: Emergency Medicine

## 2019-03-30 DIAGNOSIS — Z113 Encounter for screening for infections with a predominantly sexual mode of transmission: Secondary | ICD-10-CM | POA: Diagnosis not present

## 2019-03-30 DIAGNOSIS — F17210 Nicotine dependence, cigarettes, uncomplicated: Secondary | ICD-10-CM | POA: Insufficient documentation

## 2019-03-30 DIAGNOSIS — N898 Other specified noninflammatory disorders of vagina: Secondary | ICD-10-CM | POA: Diagnosis not present

## 2019-03-30 DIAGNOSIS — Z79899 Other long term (current) drug therapy: Secondary | ICD-10-CM | POA: Diagnosis not present

## 2019-03-30 LAB — URINALYSIS, ROUTINE W REFLEX MICROSCOPIC
Bilirubin Urine: NEGATIVE
Glucose, UA: NEGATIVE mg/dL
Hgb urine dipstick: NEGATIVE
Ketones, ur: NEGATIVE mg/dL
Leukocytes,Ua: NEGATIVE
Nitrite: NEGATIVE
Protein, ur: NEGATIVE mg/dL
Specific Gravity, Urine: 1.013 (ref 1.005–1.030)
pH: 6 (ref 5.0–8.0)

## 2019-03-30 LAB — PREGNANCY, URINE: Preg Test, Ur: NEGATIVE

## 2019-03-30 LAB — WET PREP, GENITAL
Clue Cells Wet Prep HPF POC: NONE SEEN
Sperm: NONE SEEN
Trich, Wet Prep: NONE SEEN
Yeast Wet Prep HPF POC: NONE SEEN

## 2019-03-30 NOTE — Discharge Instructions (Signed)
[facility] [address] [place], [place] [phone] Walk in GYN clinic 4PM-7:30PM Monday-Wednesday  

## 2019-03-30 NOTE — ED Triage Notes (Signed)
Pt states she thinks she has a UTI. Denies symptoms at this time, states she gets them frequently and knows what they look like.

## 2019-03-30 NOTE — ED Provider Notes (Signed)
[place] EMERGENCY DEPARTMENT Provider Note   CSN: [id number] Arrival date & time: 03/30/19  0530     History   Chief Complaint Chief Complaint  Patient presents with  . Urinary Tract Infection    HPI Fabiola Mudgett is a 28 y.o. female.     28 year old female presents with complaint of chunky vaginal discharge x1 week, concerned she has a yeast infection or BV, states she gets these infections frequently.  Patient is sexually active with 1 female partner, no new partners.  Patient denies any vaginal lesions, pelvic pain, fevers or urinary symptoms.  No other complaints or concerns.     Past Medical History:  Diagnosis Date  . Depression     There are no active problems to display for this patient.   History reviewed. No pertinent surgical history.   OB History   No obstetric history on file.      Home Medications    Prior to Admission medications   Medication Sig Start Date End Date Taking? Authorizing Provider  acetaminophen (TYLENOL) 500 MG tablet Take 1 tablet (500 mg total) by mouth every 6 (six) hours as needed. 09/08/17   Law, Bea Graff, PA-C  dimenhyDRINATE (DRAMAMINE) 50 MG tablet Take 1 tablet (50 mg total) by mouth every 8 (eight) hours as needed. 04/10/18   Hedges, Dellis Filbert, PA-C  ibuprofen (ADVIL,MOTRIN) 600 MG tablet Take 1 tablet (600 mg total) by mouth every 6 (six) hours as needed. 09/08/17   Law, Bea Graff, PA-C  metroNIDAZOLE (FLAGYL) 500 MG tablet Take 1 tablet (500 mg total) by mouth 2 (two) times daily. 04/10/18   Okey Regal, PA-C    Family History No family history on file.  Social History Social History   Tobacco Use  . Smoking status: Current Every Day Smoker    Packs/day: 0.50  . Smokeless tobacco: Current User  Substance Use Topics  . Alcohol use: Yes    Comment: social  . Drug use: Yes    Types: Marijuana     Allergies   Pertussis vaccines   Review of Systems Review of Systems   Constitutional: Negative for fever.  Gastrointestinal: Negative for abdominal pain, constipation, diarrhea, nausea and vomiting.  Genitourinary: Positive for vaginal discharge. Negative for dysuria, frequency, pelvic pain and urgency.  Musculoskeletal: Negative for back pain.  Skin: Negative for rash and wound.  Allergic/Immunologic: Negative for immunocompromised state.  Hematological: Negative for adenopathy.  All other systems reviewed and are negative.    Physical Exam Updated Vital Signs BP 129/87 (BP Location: Right Arm)   Pulse 74   Temp 98.4 F (36.9 C) (Oral)   Resp 19   SpO2 100%   Physical Exam Vitals signs and nursing note reviewed.  Constitutional:      General: She is not in acute distress.    Appearance: She is well-developed. She is not diaphoretic.  HENT:     Head: Normocephalic and atraumatic.  Cardiovascular:     Rate and Rhythm: Normal rate and regular rhythm.     Pulses: Normal pulses.     Heart sounds: Normal heart sounds.  Pulmonary:     Effort: Pulmonary effort is normal.     Breath sounds: Normal breath sounds.  Abdominal:     Palpations: Abdomen is soft.     Tenderness: There is no abdominal tenderness. There is no right CVA tenderness or left CVA tenderness.  Skin:    General: Skin is warm and dry.  Findings: No rash.  Neurological:     Mental Status: She is alert and oriented to person, place, and time.  Psychiatric:        Behavior: Behavior normal.      ED Treatments / Results  Labs (all labs ordered are listed, but only abnormal results are displayed) Labs Reviewed  WET PREP, GENITAL - Abnormal; Notable for the following components:      Result Value   WBC, Wet Prep HPF POC FEW (*)    All other components within normal limits  URINALYSIS, ROUTINE W REFLEX MICROSCOPIC  PREGNANCY, URINE  GC/CHLAMYDIA PROBE AMP ([place]) NOT AT [facility]    EKG None  Radiology No results found.  Procedures Procedures (including  critical care time)  Medications Ordered in ED Medications - No data to display   Initial Impression / Assessment and Plan / ED Course  I have reviewed the triage vital signs and the nursing notes.  Pertinent labs & imaging results that were available during my care of the patient were reviewed by me and considered in my medical decision making (see chart for details).  Clinical Course as of Mar 29 854  Wynelle Link Mar 30, 2019  4516 28 year old female presents with complaint of vaginal discharge x1 week, described as chunky.  Denies concerns STDs, does not have any abdominal pain or urinary symptoms.  Urinalysis is normal pregnancy test is negative, wet prep negative.  Add on gonorrhea chlamydia testing to patient's urine, patient referred to women's clinic if she would like to pursue further work-up.   [LM]    Clinical Course User Index [LM] Jeannie Fend, PA-C      Final Clinical Impressions(s) / ED Diagnoses   Final diagnoses:  Vaginal discharge    ED Discharge Orders    None       Jeannie Fend, PA-C 03/30/19 0855    Melene Plan, DO 03/30/19 1057

## 2019-03-30 NOTE — ED Notes (Signed)
Pt discharge instructions reviewed with the patient. Pt verbalized understanding of instructions. Pt discharged. 

## 2019-03-31 LAB — GC/CHLAMYDIA PROBE AMP (~~LOC~~) NOT AT ARMC
Chlamydia: NEGATIVE
Neisseria Gonorrhea: NEGATIVE

## 2019-06-09 ENCOUNTER — Ambulatory Visit (HOSPITAL_COMMUNITY)
Admission: EM | Admit: 2019-06-09 | Discharge: 2019-06-09 | Disposition: A | Discharge status: Home / Self Care | Payer: BLUE CROSS/BLUE SHIELD | Attending: Family Medicine | Admitting: Family Medicine

## 2019-06-09 ENCOUNTER — Other Ambulatory Visit: Payer: Self-pay

## 2019-06-09 ENCOUNTER — Encounter (HOSPITAL_COMMUNITY): Payer: Self-pay | Admitting: Emergency Medicine

## 2019-06-09 DIAGNOSIS — N760 Acute vaginitis: Secondary | ICD-10-CM | POA: Diagnosis not present

## 2019-06-09 MED ORDER — METRONIDAZOLE 500 MG PO TABS: 500.0000 mg | ORAL_TABLET | Freq: Two times a day (BID) | ORAL | 0 refills | Status: DC

## 2019-06-09 MED ORDER — FLUCONAZOLE 150 MG PO TABS: 150.0000 mg | ORAL_TABLET | Freq: Every day | ORAL | 0 refills | Status: DC

## 2019-06-09 NOTE — ED Provider Notes (Signed)
[facility]    CSN: [id number] Arrival date & time: 06/09/19  0930      History   Chief Complaint Chief Complaint  Patient presents with  . Vaginal Discharge    HPI April Bean is a 29 y.o. female.   HPI   Patient has had recurring BV and yeast infections.  She states she has been treated with metronidazole and with Diflucan.  In spite of that she continues to have discharge.  She currently has a thick discharge and some odor. We discussed recurring BV.  We discussed recurring yeast infections.  We discussed hygiene, skin products, diabetes. Patient is in a monogamous relationship with another woman, feel strongly she does not have an STD.  Past Medical History:  Diagnosis Date  . Depression     There are no problems to display for this patient.   History reviewed. No pertinent surgical history.  OB History   No obstetric history on file.      Home Medications    Prior to Admission medications   Medication Sig Start Date End Date Taking? Authorizing Provider  acetaminophen (TYLENOL) 500 MG tablet Take 1 tablet (500 mg total) by mouth every 6 (six) hours as needed. 09/08/17   Law, Waylan Boga, PA-C  dimenhyDRINATE (DRAMAMINE) 50 MG tablet Take 1 tablet (50 mg total) by mouth every 8 (eight) hours as needed. 04/10/18   Hedges, Tinnie Gens, PA-C  fluconazole (DIFLUCAN) 150 MG tablet Take 1 tablet (150 mg total) by mouth daily. Repeat in 1 week if needed 06/09/19   Eustace Moore, MD  ibuprofen (ADVIL,MOTRIN) 600 MG tablet Take 1 tablet (600 mg total) by mouth every 6 (six) hours as needed. 09/08/17   Law, Waylan Boga, PA-C  metroNIDAZOLE (FLAGYL) 500 MG tablet Take 1 tablet (500 mg total) by mouth 2 (two) times daily. 06/09/19   Eustace Moore, MD    Family History Family History  Problem Relation Age of Onset  . Diabetes Mother   . Hypertension Mother   . Healthy Father     Social History Social History   Tobacco Use  . Smoking status: Current  Every Day Smoker    Packs/day: 0.50  . Smokeless tobacco: Current User  Substance Use Topics  . Alcohol use: Yes    Comment: social  . Drug use: Yes    Types: Marijuana     Allergies   Pertussis vaccines   Review of Systems Review of Systems  Genitourinary: Positive for vaginal discharge. Negative for pelvic pain.     Physical Exam Triage Vital Signs ED Triage Vitals  Enc Vitals Group     BP 06/09/19 1035 118/71     Pulse Rate 06/09/19 1035 (!) 50     Resp 06/09/19 1035 12     Temp 06/09/19 1035 98 F (36.7 C)     Temp Source 06/09/19 1035 Oral     SpO2 06/09/19 1035 100 %     Weight --      Height --      Head Circumference --      Peak Flow --      Pain Score 06/09/19 1036 0     Pain Loc --      Pain Edu? --      Excl. in [place]? --    No data found.  Updated Vital Signs BP 118/71 (BP Location: Right Arm)   Pulse (!) 50   Temp 98 F (36.7 C) (Oral)   Resp  12   LMP 06/02/2019 (Exact Date)   SpO2 100%     Physical Exam Constitutional:      General: She is not in acute distress.    Appearance: She is well-developed.     Comments: Exam by observation  HENT:     Head: Normocephalic and atraumatic.     Mouth/Throat:     Comments: Mask in place Eyes:     Conjunctiva/sclera: Conjunctivae normal.     Pupils: Pupils are equal, round, and reactive to light.  Cardiovascular:     Rate and Rhythm: Normal rate.  Pulmonary:     Effort: Pulmonary effort is normal. No respiratory distress.  Genitourinary:    Comments: Swab deferred Musculoskeletal:        General: Normal range of motion.     Cervical back: Normal range of motion.  Skin:    General: Skin is warm and dry.  Neurological:     Mental Status: She is alert.      UC Treatments / Results  Labs (all labs ordered are listed, but only abnormal results are displayed) Labs Reviewed - No data to display  EKG   Radiology No results found.  Procedures Procedures (including critical care  time)  Medications Ordered in UC Medications - No data to display  Initial Impression / Assessment and Plan / UC Course  I have reviewed the triage vital signs and the nursing notes.  Pertinent labs & imaging results that were available during my care of the patient were reviewed by me and considered in my medical decision making (see chart for details).      Final Clinical Impressions(s) / UC Diagnoses   Final diagnoses:  Acute vaginitis     Discharge Instructions     Take a probiotic for women Take the metronidazole for one week Take the diflucan as directed Follow up with womens health   ED Prescriptions    Medication Sig Dispense Auth. Provider   fluconazole (DIFLUCAN) 150 MG tablet Take 1 tablet (150 mg total) by mouth daily. Repeat in 1 week if needed 2 tablet Raylene Everts, MD   metroNIDAZOLE (FLAGYL) 500 MG tablet Take 1 tablet (500 mg total) by mouth 2 (two) times daily. 14 tablet Raylene Everts, MD     PDMP not reviewed this encounter.   Raylene Everts, MD 06/09/19 1945

## 2019-06-09 NOTE — Discharge Instructions (Signed)
Take a probiotic for women Take the metronidazole for one week Take the diflucan as directed Follow up with womens health

## 2019-06-09 NOTE — ED Triage Notes (Signed)
Pt reports green, chunky vaginal discharge with some mild abdominal cramping that started over two months ago.  Pt states she was seen in the ED for it, but she did her own swab and she states it was negative.  Her swab was positive for WBC.  No treatment given, but pt told to follow up with Women's health.  This has not been done yet.

## 2019-09-09 ENCOUNTER — Other Ambulatory Visit: Payer: Self-pay

## 2019-09-09 ENCOUNTER — Encounter (HOSPITAL_COMMUNITY): Payer: Self-pay

## 2019-09-09 ENCOUNTER — Ambulatory Visit (HOSPITAL_COMMUNITY)
Admission: EM | Admit: 2019-09-09 | Discharge: 2019-09-09 | Disposition: A | Discharge status: Home / Self Care | Payer: BLUE CROSS/BLUE SHIELD | Attending: Family Medicine | Admitting: Family Medicine

## 2019-09-09 DIAGNOSIS — N898 Other specified noninflammatory disorders of vagina: Secondary | ICD-10-CM | POA: Diagnosis not present

## 2019-09-09 MED ORDER — FLUCONAZOLE 150 MG PO TABS: 150.0000 mg | ORAL_TABLET | Freq: Every day | ORAL | 0 refills | Status: DC

## 2019-09-09 NOTE — ED Triage Notes (Signed)
Patient reports thick, chunky cottage cheese like vaginal discharge accompanied with vaginal odor.

## 2019-09-09 NOTE — ED Provider Notes (Signed)
[place]    CSN: [id number] Arrival date & time: 09/09/19  1111      History   Chief Complaint Chief Complaint  Patient presents with  . Vaginal Discharge  . vaginal odor    HPI April Bean is a 29 y.o. female.   Patient is a 30 year old female presents today with thick, vaginal discharge with odor.  History of recurrent yeast infections.  This started a few days ago.  Denies any significant itching.  Recently changed bath soap and has been using some relief.  Last menstrual cycle started 3 days ago.  Into the dermis patient with female partner.  No concern for STDs.  ROS per HPI      Past Medical History:  Diagnosis Date  . Depression     There are no problems to display for this patient.   History reviewed. No pertinent surgical history.  OB History   No obstetric history on file.      Home Medications    Prior to Admission medications   Medication Sig Start Date End Date Taking? Authorizing Provider  acetaminophen (TYLENOL) 500 MG tablet Take 1 tablet (500 mg total) by mouth every 6 (six) hours as needed. 09/08/17   Law, Bea Graff, PA-C  dimenhyDRINATE (DRAMAMINE) 50 MG tablet Take 1 tablet (50 mg total) by mouth every 8 (eight) hours as needed. 04/10/18   Hedges, Dellis Filbert, PA-C  fluconazole (DIFLUCAN) 150 MG tablet Take 1 tablet (150 mg total) by mouth daily. 09/09/19   Loura Halt A, NP  ibuprofen (ADVIL,MOTRIN) 600 MG tablet Take 1 tablet (600 mg total) by mouth every 6 (six) hours as needed. 09/08/17   Frederica Kuster, PA-C    Family History Family History  Problem Relation Age of Onset  . Diabetes Mother   . Hypertension Mother   . Healthy Father     Social History Social History   Tobacco Use  . Smoking status: Current Every Day Smoker    Packs/day: 0.25  . Smokeless tobacco: Current User  . Tobacco comment: 2-3 cigs/day  Substance Use Topics  . Alcohol use: Yes    Comment: social  . Drug use: Yes    Types: Marijuana      Allergies   Pertussis vaccines   Review of Systems Review of Systems   Physical Exam Triage Vital Signs ED Triage Vitals  Enc Vitals Group     BP 09/09/19 1201 130/75     Pulse Rate 09/09/19 1201 64     Resp 09/09/19 1201 16     Temp 09/09/19 1201 97.9 F (36.6 C)     Temp Source 09/09/19 1201 Oral     SpO2 09/09/19 1201 100 %     Weight --      Height --      Head Circumference --      Peak Flow --      Pain Score 09/09/19 1159 0     Pain Loc --      Pain Edu? --      Excl. in [place]? --    No data found.  Updated Vital Signs BP 130/75 (BP Location: Right Arm)   Pulse 64   Temp 97.9 F (36.6 C) (Oral)   Resp 16   LMP 09/06/2019 (Within Days)   SpO2 100%   Visual Acuity Right Eye Distance:   Left Eye Distance:   Bilateral Distance:    Right Eye Near:   Left Eye Near:  Bilateral Near:     Physical Exam Vitals and nursing note reviewed.  Constitutional:      General: She is not in acute distress.    Appearance: Normal appearance. She is not ill-appearing, toxic-appearing or diaphoretic.  HENT:     Head: Normocephalic.     Nose: Nose normal.  Eyes:     Conjunctiva/sclera: Conjunctivae normal.  Pulmonary:     Effort: Pulmonary effort is normal.  Musculoskeletal:        General: Normal range of motion.     Cervical back: Normal range of motion.  Skin:    General: Skin is warm and dry.     Findings: No rash.  Neurological:     Mental Status: She is alert.  Psychiatric:        Mood and Affect: Mood normal.      UC Treatments / Results  Labs (all labs ordered are listed, but only abnormal results are displayed) Labs Reviewed - No data to display  EKG   Radiology No results found.  Procedures Procedures (including critical care time)  Medications Ordered in UC Medications - No data to display  Initial Impression / Assessment and Plan / UC Course  I have reviewed the triage vital signs and the nursing notes.  Pertinent labs &  imaging results that were available during my care of the patient were reviewed by me and considered in my medical decision making (see chart for details).     Yeast vaginitis Treated with Diflucan Recommended discontinuing some receive and only using unscented soaps due to recurrent vaginal yeast infections Follow up as needed for continued or worsening symptoms  Final Clinical Impressions(s) / UC Diagnoses   Final diagnoses:  Vaginal discharge     Discharge Instructions     Treating you for a yeast infection.  Recommended discontinuing the same receive and only using unscented products and vaginal area Follow up as needed for continued or worsening symptoms     ED Prescriptions    Medication Sig Dispense Auth. Provider   fluconazole (DIFLUCAN) 150 MG tablet Take 1 tablet (150 mg total) by mouth daily. 2 tablet Dahlia Byes A, NP     PDMP not reviewed this encounter.   Janace Aris, NP 09/09/19 1240

## 2019-09-09 NOTE — Discharge Instructions (Signed)
Treating you for a yeast infection.  Recommended discontinuing the same receive and only using unscented products and vaginal area Follow up as needed for continued or worsening symptoms

## 2019-10-29 ENCOUNTER — Inpatient Hospital Stay: Admission: RE | Admit: 2019-10-29 | Payer: BLUE CROSS/BLUE SHIELD | Source: Ambulatory Visit

## 2019-10-29 ENCOUNTER — Other Ambulatory Visit: Payer: Self-pay

## 2019-10-29 ENCOUNTER — Ambulatory Visit (INDEPENDENT_AMBULATORY_CARE_PROVIDER_SITE_OTHER)
Admission: RE | Admit: 2019-10-29 | Discharge: 2019-10-29 | Disposition: A | Discharge status: Home / Self Care | Payer: BLUE CROSS/BLUE SHIELD | Source: Ambulatory Visit

## 2019-10-29 DIAGNOSIS — N760 Acute vaginitis: Secondary | ICD-10-CM

## 2019-10-29 MED ORDER — FLUCONAZOLE 150 MG PO TABS: 150.0000 mg | ORAL_TABLET | Freq: Every day | ORAL | 0 refills | Status: DC

## 2019-10-29 MED ORDER — METRONIDAZOLE 0.75 % VA GEL: 1.0000 | Freq: Every day | VAGINAL | 0 refills | Status: DC

## 2019-10-29 NOTE — Discharge Instructions (Addendum)
Start diflucan and metrogel as directed. Monitor for any worsening of symptoms, fever, worsening abdominal pain, nausea, vomiting, to follow up for reevaluation.

## 2019-10-29 NOTE — ED Provider Notes (Signed)
Virtual Visit via Video Note:  April Bean  initiated request for Telemedicine visit with [facility] Urgent Care team. I connected with April Bean  on 10/29/2019 at 11:12 AM  for a synchronized telemedicine visit using a video enabled HIPPA compliant telemedicine application. I verified that I am speaking with April Bean  using two identifiers. Cameo Shewell Doree Fudge, PA-C  was physically located in a [facility] Urgent care site and [name] was located at a different location.   The limitations of evaluation and management by telemedicine as well as the availability of in-person appointments were discussed. Patient was informed that she  may incur a bill ( including co-pay) for this virtual visit encounter. April Bean  expressed understanding and gave verbal consent to proceed with virtual visit.     History of Present Illness:April Bean  is a 29 y.o. female presents with 2 week history of vaginal discharge. [place]umpy discharge with watery discharge. Mild vaginal itching. Abdominal cramping with some spotting that has resolved. Denies nausea, vomiting. Denies urinary symptoms. Denies fevers. LMP 10/18/2019. Sexually active with female. History of BV and yeast, states feels the same.   Past Medical History:  Diagnosis Date   Depression     Allergies  Allergen Reactions   Pertussis Vaccines Hives      Observations/Objective: General: Well appearing, nontoxic, no acute distress. Standing/moving comfortably. Head: Normocephalic, atraumatic Eye: No conjunctival injection, eyelid swelling. EOMI ENT: Mucus membranes moist, no lip cracking. No obvious nasal drainage. Pulm: Speaking in full sentences without difficulty. Normal effort. No respiratory distress, accessory muscle use. Neuro: Normal mental status. Alert and oriented x 3.   Assessment and Plan: metrogel and diflucan called into pharmacy. Can try probiotics as well. Return precautions  given.  Follow Up Instructions:    I discussed the assessment and treatment plan with the patient. The patient was provided an opportunity to ask questions and all were answered. The patient agreed with the plan and demonstrated an understanding of the instructions.   The patient was advised to call back or seek an in-person evaluation if the symptoms worsen or if the condition fails to improve as anticipated.  I provided 10 minutes of non-face-to-face time during this encounter.    Belinda Fisher, PA-C  10/29/2019 11:12 AM         Belinda Fisher, PA-C 10/29/19 1123

## 2020-01-21 ENCOUNTER — Telehealth: Payer: BLUE CROSS/BLUE SHIELD | Admitting: Nurse Practitioner

## 2020-01-21 DIAGNOSIS — B3731 Acute candidiasis of vulva and vagina: Secondary | ICD-10-CM

## 2020-01-21 DIAGNOSIS — N760 Acute vaginitis: Secondary | ICD-10-CM | POA: Diagnosis not present

## 2020-01-21 DIAGNOSIS — B9689 Other specified bacterial agents as the cause of diseases classified elsewhere: Secondary | ICD-10-CM

## 2020-01-21 DIAGNOSIS — B373 Candidiasis of vulva and vagina: Secondary | ICD-10-CM

## 2020-01-21 MED ORDER — FLUCONAZOLE 150 MG PO TABS: 150.0000 mg | ORAL_TABLET | Freq: Once | ORAL | 0 refills | Status: AC

## 2020-01-21 MED ORDER — METRONIDAZOLE 500 MG PO TABS: 500.0000 mg | ORAL_TABLET | Freq: Two times a day (BID) | ORAL | 0 refills | Status: DC

## 2020-01-21 NOTE — Addendum Note (Signed)
Addended by: Bennie Pierini on: 01/21/2020 09:49 AM   Modules accepted: Orders

## 2020-01-21 NOTE — Progress Notes (Signed)
We are sorry that you are not feeling well. Here is how we plan to help! Based on what you shared with me it looks like you: May have a vaginosis due to bacteria and possibly a yeast infection/  Vaginosis is an inflammation of the vagina that can result in discharge, itching and pain. The cause is usually a change in the normal balance of vaginal bacteria or an infection. Vaginosis can also result from reduced estrogen levels after menopause.  The most common causes of vaginosis are:   Bacterial vaginosis which results from an overgrowth of one on several organisms that are normally present in your vagina.   Yeast infections which are caused by a naturally occurring fungus called candida.   Vaginal atrophy (atrophic vaginosis) which results from the thinning of the vagina from reduced estrogen levels after menopause.   Trichomoniasis which is caused by a parasite and is commonly transmitted by sexual intercourse.  Factors that increase your risk of developing vaginosis include: Marland Kitchen Medications, such as antibiotics and steroids . Uncontrolled diabetes . Use of hygiene products such as bubble bath, vaginal spray or vaginal deodorant . Douching . Wearing damp or tight-fitting clothing . Using an intrauterine device (IUD) for birth control . Hormonal changes, such as those associated with pregnancy, birth control pills or menopause . Sexual activity . Having a sexually transmitted infection  Your treatment plan is Metronidazole or Flagyl 500mg  twice a day for 7 days.  I have electronically sent this prescription into the pharmacy that you have chosen. I also sent in a diflucan 150mg  1 po now.  Be sure to take all of the medication as directed. Stop taking any medication if you develop a rash, tongue swelling or shortness of breath. Mothers who are breast feeding should consider pumping and discarding their breast milk while on these antibiotics. However, there is no consensus that infant  exposure at these doses would be harmful.  Remember that medication creams can weaken latex condoms.   HOME CARE:  Good hygiene may prevent some types of vaginosis from recurring and may relieve some symptoms:  . Avoid baths, hot tubs and whirlpool spas. Rinse soap from your outer genital area after a shower, and dry the area well to prevent irritation. Don't use scented or harsh soaps, such as those with deodorant or antibacterial action. Avoid irritants. These include scented tampons and pads. . Wipe from front to back after using the toilet. Doing so avoids spreading fecal bacteria to your vagina.  Other things that may help prevent vaginosis include:  Marland Kitchen Don't douche. Your vagina doesn't require cleansing other than normal bathing. Repetitive douching disrupts the normal organisms that reside in the vagina and can actually increase your risk of vaginal infection. Douching won't clear up a vaginal infection. . Use a latex condom. Both female and female latex condoms may help you avoid infections spread by sexual contact. . Wear cotton underwear. Also wear pantyhose with a cotton crotch. If you feel comfortable without it, skip wearing underwear to bed. Yeast thrives in [place] Your symptoms should improve in the next day or two.  GET HELP RIGHT AWAY IF:  . You have pain in your lower abdomen ( pelvic area or over your ovaries) . You develop nausea or vomiting . You develop a fever . Your discharge changes or worsens . You have persistent pain with intercourse . You develop shortness of breath, a rapid pulse, or you faint.  These symptoms could be signs  of problems or infections that need to be evaluated by a medical provider now.  MAKE SURE YOU    Understand these instructions.  Will watch your condition.  Will get help right away if you are not doing well or get worse.  Your e-visit answers were reviewed by a board certified advanced clinical practitioner to  complete your personal care plan. Depending upon the condition, your plan could have included both over the counter or prescription medications. Please review your pharmacy choice to make sure that you have choses a pharmacy that is open for you to pick up any needed prescription, Your safety is important to [place]. If you have drug allergies check your prescription carefully.   You can use MyChart to ask questions about today's visit, request a non-urgent call back, or ask for a work or school excuse for 24 hours related to this e-Visit. If it has been greater than 24 hours you will need to follow up with your provider, or enter a new e-Visit to address those concerns. You will get a MyChart message within the next two days asking about your experience. I hope that your e-visit has been valuable and will speed your recovery.  5-10 minutes spent reviewing and documenting in chart.

## 2020-03-23 ENCOUNTER — Telehealth: Payer: BLUE CROSS/BLUE SHIELD | Admitting: Emergency Medicine

## 2020-03-23 DIAGNOSIS — N760 Acute vaginitis: Secondary | ICD-10-CM | POA: Diagnosis not present

## 2020-03-23 MED ORDER — FLUCONAZOLE 150 MG PO TABS: 150.0000 mg | ORAL_TABLET | Freq: Every day | ORAL | 1 refills | Status: DC

## 2020-03-23 NOTE — Progress Notes (Signed)
Time spent: 10 min  We are sorry that you are not feeling well. Here is how we plan to help! Based on what you shared with me it looks like you: May have a yeast vaginosis.  Please note that there are several other causes of vaginosis - bacterial, yeast, diabetes.   Your medical chart reveals you have been seen by a medical provider SIX times in the last year for similar symptoms.  Many times as well last year.  In the future, please seek specialty evaluation by gynecology for return of symptoms. E-visits are not appropriate for recurrent symptoms or symptoms that require a physical exam or laboratory testing to exclude other causes of vaginal discharge.   Vaginosis is an inflammation of the vagina that can result in discharge, itching and pain. The cause is usually a change in the normal balance of vaginal bacteria or an infection. Vaginosis can also result from reduced estrogen levels after menopause.  The most common causes of vaginosis are:   Bacterial vaginosis which results from an overgrowth of one on several organisms that are normally present in your vagina.   Yeast infections which are caused by a naturally occurring fungus called candida.   Vaginal atrophy (atrophic vaginosis) which results from the thinning of the vagina from reduced estrogen levels after menopause.   Trichomoniasis which is caused by a parasite and is commonly transmitted by sexual intercourse.  Factors that increase your risk of developing vaginosis include: Marland Kitchen Medications, such as antibiotics and steroids . Uncontrolled diabetes . Use of hygiene products such as bubble bath, vaginal spray or vaginal deodorant . Douching . Wearing damp or tight-fitting clothing . Using an intrauterine device (IUD) for birth control . Hormonal changes, such as those associated with pregnancy, birth control pills or menopause . Sexual activity . Having a sexually transmitted infection  Your treatment plan is A single Diflucan  (fluconazole) 150mg  tablet once.  I have electronically sent this prescription into the pharmacy that you have chosen. I have sent one refill, if your symptoms do not improve in 72 hours please take a second dose.   Be sure to take all of the medication as directed. Stop taking any medication if you develop a rash, tongue swelling or shortness of breath. Mothers who are breast feeding should consider pumping and discarding their breast milk while on these antibiotics. However, there is no consensus that infant exposure at these doses would be harmful.  Remember that medication creams can weaken latex condoms.   HOME CARE:  Good hygiene may prevent some types of vaginosis from recurring and may relieve some symptoms:  . Avoid baths, hot tubs and whirlpool spas. Rinse soap from your outer genital area after a shower, and dry the area well to prevent irritation. Don't use scented or harsh soaps, such as those with deodorant or antibacterial action. Marland Kitchen Avoid irritants. These include scented tampons and pads. . Wipe from front to back after using the toilet. Doing so avoids spreading fecal bacteria to your vagina.  Other things that may help prevent vaginosis include:  Marland Kitchen Don't douche. Your vagina doesn't require cleansing other than normal bathing. Repetitive douching disrupts the normal organisms that reside in the vagina and can actually increase your risk of vaginal infection. Douching won't clear up a vaginal infection. . Use a latex condom. Both female and female latex condoms may help you avoid infections spread by sexual contact. . Wear cotton underwear. Also wear pantyhose with a cotton crotch. If  you feel comfortable without it, skip wearing underwear to bed. Yeast thrives in [employer] Your symptoms should improve in the next day or two.  GET HELP RIGHT AWAY IF:  . You have pain in your lower abdomen ( pelvic area or over your ovaries) . You develop nausea or vomiting . You  develop a fever . Your discharge changes or worsens . You have persistent pain with intercourse . You develop shortness of breath, a rapid pulse, or you faint.  These symptoms could be signs of problems or infections that need to be evaluated by a medical provider now.  MAKE SURE YOU    Understand these instructions.  Will watch your condition.  Will get help right away if you are not doing well or get worse.  Your e-visit answers were reviewed by a board certified advanced clinical practitioner to complete your personal care plan. Depending upon the condition, your plan could have included both over the counter or prescription medications. Please review your pharmacy choice to make sure that you have choses a pharmacy that is open for you to pick up any needed prescription, Your safety is important to [place]. If you have drug allergies check your prescription carefully.   You can use MyChart to ask questions about today's visit, request a non-urgent call back, or ask for a work or school excuse for 24 hours related to this e-Visit. If it has been greater than 24 hours you will need to follow up with your provider, or enter a new e-Visit to address those concerns. You will get a MyChart message within the next two days asking about your experience. I hope that your e-visit has been valuable and will speed your recovery.

## 2020-09-17 ENCOUNTER — Encounter (HOSPITAL_COMMUNITY): Payer: Self-pay | Admitting: Emergency Medicine

## 2020-09-17 ENCOUNTER — Other Ambulatory Visit: Payer: Self-pay

## 2020-09-17 ENCOUNTER — Ambulatory Visit (HOSPITAL_COMMUNITY)
Admission: EM | Admit: 2020-09-17 | Discharge: 2020-09-17 | Disposition: A | Discharge status: Home / Self Care | Payer: BLUE CROSS/BLUE SHIELD | Attending: Physician Assistant | Admitting: Physician Assistant

## 2020-09-17 DIAGNOSIS — Z8249 Family history of ischemic heart disease and other diseases of the circulatory system: Secondary | ICD-10-CM | POA: Insufficient documentation

## 2020-09-17 DIAGNOSIS — U071 COVID-19: Secondary | ICD-10-CM | POA: Diagnosis not present

## 2020-09-17 DIAGNOSIS — J069 Acute upper respiratory infection, unspecified: Secondary | ICD-10-CM

## 2020-09-17 DIAGNOSIS — R42 Dizziness and giddiness: Secondary | ICD-10-CM | POA: Insufficient documentation

## 2020-09-17 DIAGNOSIS — R197 Diarrhea, unspecified: Secondary | ICD-10-CM | POA: Diagnosis not present

## 2020-09-17 DIAGNOSIS — Z887 Allergy status to serum and vaccine status: Secondary | ICD-10-CM | POA: Insufficient documentation

## 2020-09-17 DIAGNOSIS — Z79899 Other long term (current) drug therapy: Secondary | ICD-10-CM | POA: Diagnosis not present

## 2020-09-17 DIAGNOSIS — R059 Cough, unspecified: Secondary | ICD-10-CM | POA: Diagnosis not present

## 2020-09-17 DIAGNOSIS — J029 Acute pharyngitis, unspecified: Secondary | ICD-10-CM | POA: Diagnosis not present

## 2020-09-17 DIAGNOSIS — F17210 Nicotine dependence, cigarettes, uncomplicated: Secondary | ICD-10-CM | POA: Diagnosis not present

## 2020-09-17 DIAGNOSIS — R0789 Other chest pain: Secondary | ICD-10-CM | POA: Diagnosis not present

## 2020-09-17 DIAGNOSIS — R109 Unspecified abdominal pain: Secondary | ICD-10-CM | POA: Insufficient documentation

## 2020-09-17 LAB — SARS CORONAVIRUS 2 (TAT 6-24 HRS): SARS Coronavirus 2: POSITIVE — AB

## 2020-09-17 MED ORDER — PROMETHAZINE-DM 6.25-15 MG/5ML PO SYRP: 5.0000 mL | ORAL_SOLUTION | Freq: Three times a day (TID) | ORAL | 0 refills | Status: DC | PRN

## 2020-09-17 NOTE — Discharge Instructions (Addendum)
We have tested you for COVID and will be in touch with your results as soon as we have them.  Please continue alternating Tylenol and ibuprofen.  I have called in Promethazine DM for cough.  This will make you sleepy so do not drive or drink alcohol while taking it.  Make sure you are drinking plenty of fluid.  If you have any worsening symptoms please return for reevaluation.

## 2020-09-17 NOTE — ED Triage Notes (Signed)
Pt presents with cough, sore throat, body weakness/ soreness and diarrhea xs 2-3 days. States ahs taken tylenol, ibuoprofen and cold/ flu medication.

## 2020-09-17 NOTE — ED Provider Notes (Signed)
[facility]    CSN: [id number] Arrival date & time: 09/17/20  0802      History   Chief Complaint Chief Complaint  Patient presents with  . Cough  . Sore Throat  . Generalized Body Aches    HPI April Bean is a 30 y.o. female.   Patient presents today with a 4-day history of fever.  Reports highest recorded 102 F.  Reports associated sore throat, chest tightness, body aches, abdominal pain, diarrhea, dizziness.  She denies any chest pain, syncope.  She has tried numerous over-the-counter medications including TheraFlu, ibuprofen, Tylenol without improvement of symptoms.  She has had her COVID-19 vaccinations including booster.  She not receive flu shot.  Does report household sick contacts with similar symptoms.  Denies any recent antibiotic use.  Does have a history of allergies but does not take medication for this regularly.  Denies history of asthma or COPD.  She does smoke.  She has been able to perform daily activities despite symptoms.     Past Medical History:  Diagnosis Date  . Depression     There are no problems to display for this patient.   History reviewed. No pertinent surgical history.  OB History   No obstetric history on file.      Home Medications    Prior to Admission medications   Medication Sig Start Date End Date Taking? Authorizing Provider  promethazine-dextromethorphan (PROMETHAZINE-DM) 6.25-15 MG/5ML syrup Take 5 mLs by mouth 3 (three) times daily as needed for cough. 09/17/20  Yes Terrika Zuver K, PA-C  fluconazole (DIFLUCAN) 150 MG tablet Take 1 tablet (150 mg total) by mouth daily. 03/23/20   Liberty Handy, PA-C  metroNIDAZOLE (FLAGYL) 500 MG tablet Take 1 tablet (500 mg total) by mouth 2 (two) times daily. 01/21/20   Daphine Deutscher, Mary-Margaret, FNP  metroNIDAZOLE (METROGEL VAGINAL) 0.75 % vaginal gel Place 1 Applicatorful vaginally at bedtime. 10/29/19   Belinda Fisher, PA-C    Family History Family History  Problem Relation Age  of Onset  . Diabetes Mother   . Hypertension Mother   . Healthy Father     Social History Social History   Tobacco Use  . Smoking status: Current Every Day Smoker    Packs/day: 0.25  . Smokeless tobacco: Current User  . Tobacco comment: 2-3 cigs/day  Vaping Use  . Vaping Use: Never used  Substance Use Topics  . Alcohol use: Yes    Comment: social  . Drug use: Yes    Types: Marijuana     Allergies   Pertussis vaccines   Review of Systems Review of Systems  Constitutional: Positive for activity change, fatigue and fever. Negative for appetite change.  HENT: Positive for sore throat. Negative for congestion, sinus pressure and sneezing.   Respiratory: Positive for cough and chest tightness. Negative for shortness of breath.   Cardiovascular: Negative for chest pain.  Gastrointestinal: Positive for abdominal pain and diarrhea. Negative for nausea and vomiting.  Musculoskeletal: Positive for arthralgias, back pain and myalgias.  Neurological: Positive for dizziness and headaches. Negative for light-headedness.     Physical Exam Triage Vital Signs ED Triage Vitals  Enc Vitals Group     BP 09/17/20 0821 108/72     Pulse Rate 09/17/20 0821 61     Resp 09/17/20 0821 17     Temp 09/17/20 0821 97.9 F (36.6 C)     Temp Source 09/17/20 0821 Oral     SpO2 09/17/20 0821 98 %  Weight --      Height --      Head Circumference --      Peak Flow --      Pain Score 09/17/20 0819 5     Pain Loc --      Pain Edu? --      Excl. in GC? --    No data found.  Updated Vital Signs BP 108/72 (BP Location: Left Arm)   Pulse 61   Temp 97.9 F (36.6 C) (Oral)   Resp 17   LMP 08/30/2020   SpO2 98%   Visual Acuity Right Eye Distance:   Left Eye Distance:   Bilateral Distance:    Right Eye Near:   Left Eye Near:    Bilateral Near:     Physical Exam Vitals reviewed.  Constitutional:      General: She is awake. She is not in acute distress.    Appearance: Normal  appearance. She is not ill-appearing.     Comments: Very pleasant female appears stated age no acute distress  HENT:     Head: Normocephalic and atraumatic.     Right Ear: Tympanic membrane, ear canal and external ear normal. Tympanic membrane is not erythematous or bulging.     Left Ear: Tympanic membrane, ear canal and external ear normal. Tympanic membrane is not erythematous or bulging.     Nose:     Right Sinus: No maxillary sinus tenderness or frontal sinus tenderness.     Left Sinus: No maxillary sinus tenderness or frontal sinus tenderness.     Mouth/Throat:     Pharynx: Uvula midline. No oropharyngeal exudate or posterior oropharyngeal erythema.     Tonsils: 1+ on the right. 1+ on the left.  Eyes:     Pupils: Pupils are equal, round, and reactive to light.  Cardiovascular:     Rate and Rhythm: Normal rate and regular rhythm.     Heart sounds: No murmur heard.   Pulmonary:     Effort: Pulmonary effort is normal.     Breath sounds: Normal breath sounds. No wheezing, rhonchi or rales.     Comments: Clear to auscultation bilaterally Abdominal:     General: Bowel sounds are normal.     Palpations: Abdomen is soft.     Tenderness: There is no abdominal tenderness.  Musculoskeletal:     Cervical back: Normal range of motion and neck supple.  Lymphadenopathy:     Head:     Right side of head: No submental, submandibular or tonsillar adenopathy.     Left side of head: No submental, submandibular or tonsillar adenopathy.     Cervical: No cervical adenopathy.  Psychiatric:        Behavior: Behavior is cooperative.      UC Treatments / Results  Labs (all labs ordered are listed, but only abnormal results are displayed) Labs Reviewed  SARS CORONAVIRUS 2 (TAT 6-24 HRS)    EKG   Radiology No results found.  Procedures Procedures (including critical care time)  Medications Ordered in UC Medications - No data to display  Initial Impression / Assessment and Plan / UC  Course  I have reviewed the triage vital signs and the nursing notes.  Pertinent labs & imaging results that were available during my care of the patient were reviewed by me and considered in my medical decision making (see chart for details).     Patient resents for COVID-19-results pending.  Indication for flu testing as patient been symptomatic  for more than 48 hours and this would not change management.  She was prescribed Promethazine DM for cough with instruction not to drive or drink alcohol with this medication.  She was encouraged to use over-the-counter medications including Tylenol, ibuprofen, Mucinex, Flonase for symptom relief.  Requested she drink plenty of fluid.  Strict return precautions given to which patient expressed understanding.  Final Clinical Impressions(s) / UC Diagnoses   Final diagnoses:  Upper respiratory tract infection, unspecified type  Cough     Discharge Instructions     We have tested you for COVID and will be in touch with your results as soon as we have them.  Please continue alternating Tylenol and ibuprofen.  I have called in Promethazine DM for cough.  This will make you sleepy so do not drive or drink alcohol while taking it.  Make sure you are drinking plenty of fluid.  If you have any worsening symptoms please return for reevaluation.    ED Prescriptions    Medication Sig Dispense Auth. Provider   promethazine-dextromethorphan (PROMETHAZINE-DM) 6.25-15 MG/5ML syrup Take 5 mLs by mouth 3 (three) times daily as needed for cough. 118 mL Reana Chacko K, PA-C     PDMP not reviewed this encounter.   Jeani Hawking, PA-C 09/17/20 [phone]

## 2020-11-10 ENCOUNTER — Telehealth: Payer: BLUE CROSS/BLUE SHIELD | Admitting: Family

## 2020-11-10 DIAGNOSIS — N898 Other specified noninflammatory disorders of vagina: Secondary | ICD-10-CM

## 2020-11-10 NOTE — Progress Notes (Signed)
Based on what you shared with me, I feel your condition warrants further evaluation and I recommend that you be seen in a face to face visit.  Given your discharge you need to be seen face to face for further testing.    NOTE: There will be NO CHARGE for this eVisit   If you are having a true medical emergency please call 911.      For an urgent face to face visit, [place] has six urgent care centers for your convenience:     [facility] at [facility] Directions [phone] [address] [place], [place] [address]    [facility]) Get Driving Directions [phone] [address] [place], [place] [address]  [facility]) Get Driving Directions [phone] [address] [place],  [place]  [address]  [facility] at [facility] Get Driving Directions [phone] [address] [place], [place] [address]   [facility]gent [facility] at [facility] Get Driving Directions  [phone] [address] [place], [place] [address]   [facility] Urgent [facility] at [facility] Directions [phone] [address], [address] [place], [place] [address]  Your MyChart E-visit questionnaire answers were reviewed by a board certified advanced clinical practitioner to complete your personal care plan based on your specific symptoms.  Thank you for using e-Visits.

## 2020-11-16 ENCOUNTER — Telehealth: Payer: BLUE CROSS/BLUE SHIELD | Admitting: Physician Assistant

## 2020-11-16 DIAGNOSIS — N760 Acute vaginitis: Secondary | ICD-10-CM | POA: Diagnosis not present

## 2020-11-16 MED ORDER — FLUCONAZOLE 150 MG PO TABS: 150.0000 mg | ORAL_TABLET | Freq: Once | ORAL | 0 refills | Status: AC

## 2020-11-16 MED ORDER — METRONIDAZOLE 500 MG PO TABS: 500.0000 mg | ORAL_TABLET | Freq: Two times a day (BID) | ORAL | 0 refills | Status: DC

## 2020-11-16 NOTE — Progress Notes (Signed)
Virtual Visit Consent   April Bean, you are scheduled for a virtual visit with a [facility] provider today.     Just as with appointments in the office, your consent must be obtained to participate.  Your consent will be active for this visit and any virtual visit you may have with one of our providers in the next 365 days.     If you have a MyChart account, a copy of this consent can be sent to you electronically.  All virtual visits are billed to your insurance company just like a traditional visit in the office.    As this is a virtual visit, video technology does not allow for your provider to perform a traditional examination.  This may limit your provider's ability to fully assess your condition.  If your provider identifies any concerns that need to be evaluated in person or the need to arrange testing (such as labs, EKG, etc.), we will make arrangements to do so.     Although advances in technology are sophisticated, we cannot ensure that it will always work on either your end or our end.  If the connection with a video visit is poor, the visit may have to be switched to a telephone visit.  With either a video or telephone visit, we are not always able to ensure that we have a secure connection.     I need to obtain your verbal consent now.   Are you willing to proceed with your visit today?    April Bean has provided verbal consent on 11/16/2020 for a virtual visit (video or telephone).   Piedad Climes, [place]   Date: 11/16/2020 12:02 PM   Virtual Visit via Video Note   I, Piedad Climes, connected with  April Bean  ([id number], Jun 25, 1990) on 11/16/20 at 11:45 AM EDT by a video-enabled telemedicine application and verified that I am speaking with the correct person using two identifiers.  Location: Patient: Virtual Visit Location Patient: Home Provider: Virtual Visit Location Provider: Home Office   I discussed the limitations of evaluation and management by  telemedicine and the availability of in person appointments. The patient expressed understanding and agreed to proceed.    History of Present Illness: April Bean is a 30 y.o. who identifies as a female who was assigned female at birth, and is being seen today for possible BV. Patient endorses history of BV and noted this feels identical to prior episodes. In the past week has noted foul-smelling discharge and mild irritation. Denies fever, chills, vaginal pain. Denies urinary symptoms or concern for STI. Denies concern for pregnancy. Recently had to use a different soap and states this always flares up BV for her. Also noted secondary yeast infections with treatment for BV. Thankfully has not had an issue in some time.   HPI: HPI  Problems: There are no problems to display for this patient.   Allergies:  Allergies  Allergen Reactions   Pertussis Vaccines Hives   Medications:  Current Outpatient Medications:    fluconazole (DIFLUCAN) 150 MG tablet, Take 1 tablet (150 mg total) by mouth once for 1 dose. May repeat in 3 days if needed, Disp: 2 tablet, Rfl: 0   metroNIDAZOLE (FLAGYL) 500 MG tablet, Take 1 tablet (500 mg total) by mouth 2 (two) times daily., Disp: 14 tablet, Rfl: 0  Observations/Objective: Patient is well-developed, well-nourished in no acute distress.  Resting comfortably at home.  Head is normocephalic, atraumatic.  No labored breathing. Speech is  clear and coherent with logical content.  Patient is alert and oriented at baseline.   Assessment and Plan: 1. Acute vaginitis - metroNIDAZOLE (FLAGYL) 500 MG tablet; Take 1 tablet (500 mg total) by mouth 2 (two) times daily.  Dispense: 14 tablet; Refill: 0 - fluconazole (DIFLUCAN) 150 MG tablet; Take 1 tablet (150 mg total) by mouth once for 1 dose. May repeat in 3 days if needed  Dispense: 2 tablet; Refill: 0 History of BV. Classic symptoms. No alarm signs/symptoms present. Will Rx Flagyl BID x 7 days. Supportive measures  reviewed. Will add on Diflucan for antibiotic-associated yeast infection. Recommend follow-up with PCP/GYN for any non-resolving or recurring symptoms.  Follow Up Instructions: I discussed the assessment and treatment plan with the patient. The patient was provided an opportunity to ask questions and all were answered. The patient agreed with the plan and demonstrated an understanding of the instructions.  A copy of instructions were sent to the patient via MyChart.  The patient was advised to call back or seek an in-person evaluation if the symptoms worsen or if the condition fails to improve as anticipated.  Time:  I spent 12 minutes with the patient via telehealth technology discussing the above problems/concerns.    Piedad Climes, PA-C

## 2021-01-15 ENCOUNTER — Other Ambulatory Visit: Payer: Self-pay

## 2021-01-15 ENCOUNTER — Emergency Department (HOSPITAL_COMMUNITY)
Admission: EM | Admit: 2021-01-15 | Discharge: 2021-01-15 | Disposition: A | Discharge status: Home / Self Care | Payer: BLUE CROSS/BLUE SHIELD | Attending: Emergency Medicine | Admitting: Emergency Medicine

## 2021-01-15 ENCOUNTER — Encounter (HOSPITAL_COMMUNITY): Payer: Self-pay | Admitting: Emergency Medicine

## 2021-01-15 DIAGNOSIS — F17210 Nicotine dependence, cigarettes, uncomplicated: Secondary | ICD-10-CM | POA: Diagnosis not present

## 2021-01-15 DIAGNOSIS — S61452A Open bite of left hand, initial encounter: Secondary | ICD-10-CM | POA: Insufficient documentation

## 2021-01-15 DIAGNOSIS — Z23 Encounter for immunization: Secondary | ICD-10-CM | POA: Insufficient documentation

## 2021-01-15 DIAGNOSIS — W540XXA Bitten by dog, initial encounter: Secondary | ICD-10-CM | POA: Diagnosis not present

## 2021-01-15 DIAGNOSIS — S81852A Open bite, left lower leg, initial encounter: Secondary | ICD-10-CM | POA: Insufficient documentation

## 2021-01-15 MED ORDER — AMOXICILLIN-POT CLAVULANATE 875-125 MG PO TABS
1.0000 | ORAL_TABLET | Freq: Two times a day (BID) | ORAL | 0 refills | Status: DC
Start: 2021-01-15 — End: 2021-03-27

## 2021-01-15 MED ORDER — TETANUS-DIPHTH-ACELL PERTUSSIS 5-2.5-18.5 LF-MCG/0.5 IM SUSY
0.5000 mL | PREFILLED_SYRINGE | Freq: Once | INTRAMUSCULAR | Status: AC
  Administered 2021-01-15: 0.5 mL via INTRAMUSCULAR
  Filled 2021-01-15: qty 0.5

## 2021-01-15 MED ORDER — AMOXICILLIN-POT CLAVULANATE 875-125 MG PO TABS
1.0000 | ORAL_TABLET | Freq: Once | ORAL | Status: AC
  Administered 2021-01-15: 1 via ORAL
  Filled 2021-01-15: qty 1

## 2021-01-15 MED ORDER — AMOXICILLIN-POT CLAVULANATE 875-125 MG PO TABS: 1.0000 | ORAL_TABLET | Freq: Two times a day (BID) | ORAL | 0 refills | Status: DC

## 2021-01-15 NOTE — ED Provider Notes (Signed)
[facility] EMERGENCY DEPARTMENT Provider Note   CSN: [id number] Arrival date & time: 01/15/21  1426     History No chief complaint on file.   April Bean is a 30 y.o. female.  Pt is a 30 yo female presenting for dog bite. Pt states this morning she was bit by her neighbors domestic dog in the left hand and left lower extremity after trying to break up a dog fight between their pets. Pt denies any bleeding or swelling at this time. Tdap is not up to date. Denies sensation or motor deficits.   The history is provided by the patient. No language interpreter was used.      Past Medical History:  Diagnosis Date   Depression     There are no problems to display for this patient.   History reviewed. No pertinent surgical history.   OB History   No obstetric history on file.     Family History  Problem Relation Age of Onset   Diabetes Mother    Hypertension Mother    Healthy Father     Social History   Tobacco Use   Smoking status: Every Day    Packs/day: 0.25    Types: Cigarettes   Smokeless tobacco: Current   Tobacco comments:    2-3 cigs/day  Vaping Use   Vaping Use: Every day  Substance Use Topics   Alcohol use: Yes    Comment: social   Drug use: Yes    Types: Marijuana    Home Medications Prior to Admission medications   Medication Sig Start Date End Date Taking? Authorizing Provider  metroNIDAZOLE (FLAGYL) 500 MG tablet Take 1 tablet (500 mg total) by mouth 2 (two) times daily. 11/16/20   Waldon Merl, PA-C    Allergies    Pertussis vaccines  Review of Systems   Review of Systems  Constitutional:  Negative for chills and fever.  Respiratory:  Negative for cough and shortness of breath.   Cardiovascular:  Negative for chest pain and palpitations.  Gastrointestinal:  Negative for abdominal pain and vomiting.  Skin:  Positive for wound. Negative for color change and rash.  Neurological:  Negative for weakness and numbness.   All other systems reviewed and are negative.  Physical Exam Updated Vital Signs BP (!) 141/93 (BP Location: Left Arm)   Pulse 60   Temp 99.1 F (37.3 C) (Oral)   Resp 16   SpO2 98%   Physical Exam Vitals and nursing note reviewed.  Constitutional:      Appearance: She is obese.  HENT:     Head: Normocephalic and atraumatic.  Cardiovascular:     Rate and Rhythm: Normal rate and regular rhythm.     Pulses: Normal pulses.  Pulmonary:     Effort: Pulmonary effort is normal. No respiratory distress.  Skin:    Capillary Refill: Capillary refill takes less than 2 seconds.       Neurological:     General: No focal deficit present.     Mental Status: She is alert and oriented to person, place, and time.    ED Results / Procedures / Treatments   Labs (all labs ordered are listed, but only abnormal results are displayed) Labs Reviewed - No data to display  EKG None  Radiology No results found.  Procedures Procedures   Medications Ordered in ED Medications  Tdap (BOOSTRIX) injection 0.5 mL (has no administration in time range)  amoxicillin-clavulanate (AUGMENTIN) 875-125 MG per tablet 1  tablet (has no administration in time range)    ED Course  I have reviewed the triage vital signs and the nursing notes.  Pertinent labs & imaging results that were available during my care of the patient were reviewed by me and considered in my medical decision making (see chart for details).    MDM Rules/Calculators/A&P                          3:56 PM 30 yo female presenting for dog bite. Patient is Aox3, no acute distress, afebrile, with stable vitals. Physical exam demonstrates puncture wound to left thenar eminence as well as left anterior shin. No foreign bodies. Wounds irrigated and gauze applied. Tdap and Augmentin given in ED. Dog that bit patient was a domestic pet-low concern for rabies.   Patient in no distress and overall condition improved here in the ED. Detailed  discussions were had with the patient regarding current findings, and need for close f/u with PCP or on call doctor. Augmentin sent to pharmacy and patient education on signs/symptoms of infection and when to return.    The patient has been instructed to return immediately if the symptoms worsen in any way for re-evaluation. Patient verbalized understanding and is in agreement with current care plan. All questions answered prior to discharge.       Final Clinical Impression(s) / ED Diagnoses Final diagnoses:  Dog bite, initial encounter    Rx / DC Orders ED Discharge Orders     None        Franne Forts, DO 01/15/21 1603

## 2021-01-15 NOTE — ED Triage Notes (Signed)
Pt reports dog bites to L lower leg and bilateral hands just PTA.  States it is a dog in her neighborhood and she spoke to owner that states dog is vaccinated but didn't see records.  Bleeding controlled.  Band-aids in place on arrival.

## 2021-01-15 NOTE — Discharge Instructions (Addendum)
-  Keep wounds dry and clean -Use antibacterial soap when washing -Take Augmentin (antibiotic) to prevent infection

## 2021-01-15 NOTE — ED Notes (Signed)
ED Provider at bedside. 

## 2021-03-07 ENCOUNTER — Inpatient Hospital Stay (HOSPITAL_COMMUNITY): Admit: 2021-03-07 | Payer: BLUE CROSS/BLUE SHIELD

## 2021-03-07 ENCOUNTER — Ambulatory Visit (INDEPENDENT_AMBULATORY_CARE_PROVIDER_SITE_OTHER): Payer: BLUE CROSS/BLUE SHIELD | Admitting: Obstetrics and Gynecology

## 2021-03-07 ENCOUNTER — Encounter: Payer: Self-pay | Admitting: Obstetrics and Gynecology

## 2021-03-07 ENCOUNTER — Other Ambulatory Visit: Payer: Self-pay

## 2021-03-07 VITALS — BP 126/75 | HR 63 | Ht 67.0 in | Wt 206.3 lb

## 2021-03-07 DIAGNOSIS — Z113 Encounter for screening for infections with a predominantly sexual mode of transmission: Secondary | ICD-10-CM | POA: Insufficient documentation

## 2021-03-07 DIAGNOSIS — Z3169 Encounter for other general counseling and advice on procreation: Secondary | ICD-10-CM

## 2021-03-07 DIAGNOSIS — Z01419 Encounter for gynecological examination (general) (routine) without abnormal findings: Secondary | ICD-10-CM

## 2021-03-07 NOTE — Progress Notes (Signed)
GYNECOLOGY ANNUAL PREVENTATIVE CARE ENCOUNTER NOTE  History:     April Bean is a 30 y.o. G0P0000 female here for a routine annual gynecologic exam.  Current complaints: Desires conception.   Denies abnormal vaginal bleeding, discharge, pelvic pain, problems with intercourse or other gynecologic concerns.  Pt is in a long term relationship with her female partner.  They are interested in conceiving.They have selected someone to provide donor sperm.  Pt notes regular menses lasting 4-5 days.  She also requests STD check.   Gynecologic History Patient's last menstrual period was 02/28/2021. Contraception: none Last Pap: results unavailable  Obstetric History OB History  Gravida Para Term Preterm AB Living  0 0 0 0 0 0  SAB IAB Ectopic Multiple Live Births  0 0 0 0 0    Past Medical History:  Diagnosis Date   Depression     No past surgical history on file.  Current Outpatient Medications on File Prior to Visit  Medication Sig Dispense Refill   amoxicillin-clavulanate (AUGMENTIN) 875-125 MG tablet Take 1 tablet by mouth every 12 (twelve) hours. 14 tablet 0   metroNIDAZOLE (FLAGYL) 500 MG tablet Take 1 tablet (500 mg total) by mouth 2 (two) times daily. 14 tablet 0   No current facility-administered medications on file prior to visit.    Allergies  Allergen Reactions   Pertussis Vaccines Hives    Social History:  reports that she has been smoking cigarettes. She has been smoking an average of .25 packs per day. She uses smokeless tobacco. She reports current alcohol use. She reports current drug use. Drug: Marijuana.  Family History  Problem Relation Age of Onset   Diabetes Mother    Hypertension Mother    Healthy Father     The following portions of the patient's history were reviewed and updated as appropriate: allergies, current medications, past family history, past medical history, past social history, past surgical history and problem list.  Review of  Systems Pertinent items noted in HPI and remainder of comprehensive ROS otherwise negative.  Physical Exam:  BP 126/75   Pulse 63   Ht 5\' 7"  (1.702 m)   Wt 206 lb 4.8 oz (93.6 kg)   LMP 02/28/2021   BMI 32.31 kg/m  CONSTITUTIONAL: Well-developed, well-nourished female in no acute distress.  HENT:  Normocephalic, atraumatic, External right and left ear normal. Oropharynx is clear and moist EYES: Conjunctivae and EOM are normal.  NECK: Normal range of motion, supple, no masses.  Normal thyroid.  SKIN: Skin is warm and dry. No rash noted. Not diaphoretic. No erythema. No pallor. MUSCULOSKELETAL: Normal range of motion. No tenderness.  No cyanosis, clubbing, or edema.  2+ distal pulses. NEUROLOGIC: Alert and oriented to person, place, and time. Normal reflexes, muscle tone coordination.  PSYCHIATRIC: Normal mood and affect. Normal behavior. Normal judgment and thought content. CARDIOVASCULAR: Normal heart rate noted, regular rhythm RESPIRATORY: Clear to auscultation bilaterally. Effort and breath sounds normal, no problems with respiration noted. BREASTS: Symmetric in size. No masses, tenderness, skin changes, nipple drainage, or lymphadenopathy bilaterally. Performed in the presence of a chaperone. ABDOMEN: Soft, no distention noted.  No tenderness, rebound or guarding.  PELVIC: Normal appearing external genitalia and urethral meatus; normal appearing vaginal mucosa and cervix.  No abnormal discharge noted.  Pap smear obtained.  Normal uterine size, no other palpable masses, no uterine or adnexal tenderness.  Performed in the presence of a chaperone.   Assessment and Plan:    1. Screening for  STD (sexually transmitted disease)  - Cytology - PAP( [facility]) - HIV Antibody (routine testing w rflx) - RPR - Hepatitis C Antibody - Hepatitis B Surface AntiGEN - Cervicovaginal ancillary only( [facility])  2. Well woman exam with routine gynecological exam Normal annual exam - Cytology -  PAP( [facility])  3. Women's annual routine gynecological examination   4. Screen for STD (sexually transmitted disease)   5. Pre-conception counseling As pt only has female partner she may need IUI, will refer to [facility] for further consultation. Can provide OB care once pregnant. - Ambulatory referral to Endocrinology  Will follow up results of pap smear and manage accordingly. Routine preventative health maintenance measures emphasized. Please refer to After Visit Summary for other counseling recommendations.     F/u in 1 year or PRN Mariel Aloe, MD, FACOG Obstetrician & Gynecologist, [facility] for [employer], [facility]

## 2021-03-08 LAB — HIV ANTIBODY (ROUTINE TESTING W REFLEX): HIV Screen 4th Generation wRfx: NONREACTIVE

## 2021-03-08 LAB — RPR: RPR Ser Ql: NONREACTIVE

## 2021-03-08 LAB — HEPATITIS C ANTIBODY: Hep C Virus Ab: <0.1 s/co ratio (ref 0.0–0.9)

## 2021-03-08 LAB — CERVICOVAGINAL ANCILLARY ONLY
Chlamydia: NEGATIVE
Comment: NEGATIVE
Comment: NEGATIVE
Comment: NORMAL
Neisseria Gonorrhea: NEGATIVE
Trichomonas: NEGATIVE

## 2021-03-08 LAB — HEPATITIS B SURFACE ANTIGEN: Hepatitis B Surface Ag: NEGATIVE

## 2021-03-10 LAB — CYTOLOGY - PAP
Chlamydia: NEGATIVE
Comment: NEGATIVE
Comment: NEGATIVE
Comment: NEGATIVE
Comment: NORMAL
Diagnosis: NEGATIVE
High risk HPV: NEGATIVE
Neisseria Gonorrhea: NEGATIVE
Trichomonas: NEGATIVE

## 2021-03-27 ENCOUNTER — Telehealth: Payer: BC Managed Care – PPO | Admitting: Emergency Medicine

## 2021-03-27 DIAGNOSIS — N898 Other specified noninflammatory disorders of vagina: Secondary | ICD-10-CM

## 2021-03-27 MED ORDER — FLUCONAZOLE 150 MG PO TABS: ORAL_TABLET | ORAL | 0 refills | Status: DC

## 2021-03-27 MED ORDER — METRONIDAZOLE 500 MG PO TABS: 500.0000 mg | ORAL_TABLET | Freq: Two times a day (BID) | ORAL | 0 refills | Status: DC

## 2021-03-27 NOTE — Patient Instructions (Addendum)
  April Bean, thank you for joining Rennis Harding, PA-C for today's virtual visit.  While this provider is not your primary care provider (PCP), if your PCP is located in our provider database this encounter information will be shared with them immediately following your visit.  Consent: (Patient) April Bean provided verbal consent for this virtual visit at the beginning of the encounter.  Current Medications:  Current Outpatient Medications:    fluconazole (DIFLUCAN) 150 MG tablet, Take one dose by mouth, wait 72 hours, and then take second dose by mouth, Disp: 2 tablet, Rfl: 0   metroNIDAZOLE (FLAGYL) 500 MG tablet, Take 1 tablet (500 mg total) by mouth 2 (two) times daily., Disp: 14 tablet, Rfl: 0   Medications ordered in this encounter:  Meds ordered this encounter  Medications   metroNIDAZOLE (FLAGYL) 500 MG tablet    Sig: Take 1 tablet (500 mg total) by mouth 2 (two) times daily.    Dispense:  14 tablet    Refill:  0    Order Specific Question:   Supervising Provider    Answer:   MILLER, BRIAN [[id number]]   fluconazole (DIFLUCAN) 150 MG tablet    Sig: Take one dose by mouth, wait 72 hours, and then take second dose by mouth    Dispense:  2 tablet    Refill:  0    Order Specific Question:   Supervising Provider    Answer:   Eber Hong [[id number]]     *If you need refills on other medications prior to your next appointment, please contact your pharmacy*  Follow-Up: Call back or seek an in-person evaluation if the symptoms worsen or if the condition fails to improve as anticipated.  Other Instructions Symptoms sound consistent with BV and/or yeast Prescribed metronidazole 500 mg twice daily for 7 days (do not take while consuming alcohol) Prescribed diflucan 200 mg once daily and then second dose 72 hours later Take medications as prescribed and to completion We will follow up with you regarding the results of your test Follow up with PCP if symptoms persists Follow up in  person at urgent care or go to ER if you have any new or worsening symptoms such as fever, abdominal pain, vaginal pain/ bleeding, nausea, vomiting, etc...   If you have been instructed to have an in-person evaluation today at a local Urgent Care facility, please use the link below. It will take you to a list of all of our available [facility], including address, phone number and hours of operation. Please do not delay care.  [facility]  If you or a family member do not have a primary care provider, use the link below to schedule a visit and establish care. When you choose a [facility] primary care physician or advanced practice provider, you gain a long-term partner in health. Find a Primary Care Provider  Learn more about [facility]'s in-office and virtual care options: [facility] - Get Care Now

## 2021-03-27 NOTE — Progress Notes (Signed)
Virtual Visit Consent   April Bean, you are scheduled for a virtual visit with a  provider today.     Just as with appointments in the office, your consent must be obtained to participate.  Your consent will be active for this visit and any virtual visit you may have with one of our providers in the next 365 days.     If you have a MyChart account, a copy of this consent can be sent to you electronically.  All virtual visits are billed to your insurance company just like a traditional visit in the office.    As this is a virtual visit, video technology does not allow for your provider to perform a traditional examination.  This may limit your provider's ability to fully assess your condition.  If your provider identifies any concerns that need to be evaluated in person or the need to arrange testing (such as labs, EKG, etc.), we will make arrangements to do so.     Although advances in technology are sophisticated, we cannot ensure that it will always work on either your end or our end.  If the connection with a video visit is poor, the visit may have to be switched to a telephone visit.  With either a video or telephone visit, we are not always able to ensure that we have a secure connection.     I need to obtain your verbal consent now.   Are you willing to proceed with your visit today? yes   [name] has provided verbal consent on 03/27/2021 for a virtual visit (video or telephone).   April Bean, [place]   Date: 03/27/2021 12:06 PM   Virtual Visit via Video Note   I, April Bean, connected with  Akeya Ryther  ([id number], 1990/12/27) on 03/27/21 at 12:00 PM EST by a video-enabled telemedicine application and verified that I am speaking with the correct person using two identifiers.  Location: Patient: Virtual Visit Location Patient: Mobile Provider: Virtual Visit Location Provider: Home Office   I discussed the limitations of evaluation and management by  telemedicine and the availability of in person appointments. The patient expressed understanding and agreed to proceed.    History of Present Illness: April Bean is a 30 y.o. who identifies as a female who was assigned female at birth, and is being seen today for white thick discharge started last night.  Admits to changing soap.  She has NOT tried OTC medications.  Denies aggravating factors.  She reports similar symptoms in the past and was diagnosed with BV and yeast.  Reports recent STI check, and since then has not been sexually active.  Reports vaginal odor.  She denies fever, chills, nausea, vomiting, vaginal bleeding, dyspareunia, vaginal lesions.   HPI: HPI  Problems:  Patient Active Problem List   Diagnosis Date Noted   Women's annual routine gynecological examination 03/07/2021   Screen for STD (sexually transmitted disease) 03/07/2021   Pre-conception counseling 03/07/2021    Allergies:  Allergies  Allergen Reactions   Pertussis Vaccines Hives   Medications:  Current Outpatient Medications:    fluconazole (DIFLUCAN) 150 MG tablet, Take one dose by mouth, wait 72 hours, and then take second dose by mouth, Disp: 2 tablet, Rfl: 0   metroNIDAZOLE (FLAGYL) 500 MG tablet, Take 1 tablet (500 mg total) by mouth 2 (two) times daily., Disp: 14 tablet, Rfl: 0  Observations/Objective: Patient is well-developed, well-nourished in no acute distress.  Resting comfortably in vehicle Head is normocephalic,  atraumatic.  No labored breathing. Speaking full sentences without difficulty Speech is clear and coherent with logical content.  Patient is alert and oriented at baseline.    Assessment and Plan: 1. Vaginal discharge  2. Vaginal odor Symptoms sound consistent with BV and/or yeast Prescribed metronidazole 500 mg twice daily for 7 days (do not take while consuming alcohol) Prescribed diflucan 200 mg once daily and then second dose 72 hours later Take medications as prescribed and  to completion We will follow up with you regarding the results of your test Follow up with PCP if symptoms persists Follow up in person at urgent care or go to ER if you have any new or worsening symptoms such as fever, abdominal pain, vaginal pain/ bleeding, nausea, vomiting, etc...  Follow Up Instructions: I discussed the assessment and treatment plan with the patient. The patient was provided an opportunity to ask questions and all were answered. The patient agreed with the plan and demonstrated an understanding of the instructions.  A copy of instructions were sent to the patient via MyChart unless otherwise noted below.   The patient was advised to call back or seek an in-person evaluation if the symptoms worsen or if the condition fails to improve as anticipated.  Time:  I spent 5-10 minutes with the patient via telehealth technology discussing the above problems/concerns.    April Harding, PA-C

## 2021-04-02 ENCOUNTER — Ambulatory Visit (HOSPITAL_COMMUNITY)
Admission: EM | Admit: 2021-04-02 | Discharge: 2021-04-02 | Disposition: A | Discharge status: Home / Self Care | Payer: BLUE CROSS/BLUE SHIELD | Attending: Physician Assistant | Admitting: Physician Assistant

## 2021-04-02 ENCOUNTER — Encounter (HOSPITAL_COMMUNITY): Payer: Self-pay

## 2021-04-02 ENCOUNTER — Other Ambulatory Visit: Payer: Self-pay

## 2021-04-02 DIAGNOSIS — J40 Bronchitis, not specified as acute or chronic: Secondary | ICD-10-CM

## 2021-04-02 DIAGNOSIS — J4521 Mild intermittent asthma with (acute) exacerbation: Secondary | ICD-10-CM

## 2021-04-02 DIAGNOSIS — R051 Acute cough: Secondary | ICD-10-CM

## 2021-04-02 MED ORDER — ALBUTEROL SULFATE HFA 108 (90 BASE) MCG/ACT IN AERS
2.0000 | INHALATION_SPRAY | Freq: Once | RESPIRATORY_TRACT | Status: AC
  Administered 2021-04-02: 2 via RESPIRATORY_TRACT

## 2021-04-02 MED ORDER — METHYLPREDNISOLONE SODIUM SUCC 125 MG IJ SOLR
INTRAMUSCULAR | Status: AC
  Filled 2021-04-02: qty 2

## 2021-04-02 MED ORDER — PREDNISONE 10 MG (21) PO TBPK: ORAL_TABLET | ORAL | 0 refills | Status: DC

## 2021-04-02 MED ORDER — METHYLPREDNISOLONE SODIUM SUCC 125 MG IJ SOLR
80.0000 mg | Freq: Once | INTRAMUSCULAR | Status: AC
  Administered 2021-04-02: 80 mg via INTRAMUSCULAR

## 2021-04-02 MED ORDER — ALBUTEROL SULFATE HFA 108 (90 BASE) MCG/ACT IN AERS
INHALATION_SPRAY | RESPIRATORY_TRACT | Status: AC
  Filled 2021-04-02: qty 6.7

## 2021-04-02 NOTE — Discharge Instructions (Signed)
I am concerned that you have the beginning of asthma.  Please start prednisone taper as we discussed to help with the symptoms.  While you are taking this, do not take NSAIDs including aspirin, ibuprofen/Advil, naproxen/Aleve as it can cause stomach bleeding.  You can use Tylenol if needed.  Use your albuterol inhaler every 4-6 hours as needed.  If you are having to use this regularly please return so we can start a maintenance medication.  If you have any severe symptoms including shortness of breath, cough, wheezing, symptoms not responding to albuterol you need to be reevaluated immediately.

## 2021-04-02 NOTE — ED Triage Notes (Signed)
Pt reports cough and l"ung feels weird when breathing" x 1 week after started working ina cold environment.

## 2021-04-02 NOTE — ED Provider Notes (Signed)
[facility]    CSN: [id number] Arrival date & time: 04/02/21  1007      History   Chief Complaint Chief Complaint  Patient presents with   Cough    HPI April Bean is a 30 y.o. female.   Patient presents today with a 1 week history of URI symptoms.  Reports that she initially had some nasal congestion but this is improved and she continues to have cough as well as chest tightness and shortness of breath.  She denies history of asthma or COPD but does smoke.  She reports symptoms have worsened after she started a new job where she is working in the cold.  She has had COVID in the past but not within the past 3 months.  Has not had influenza or COVID-19 vaccine.  She is currently taking Flagyl for bacterial vaginosis but denies additional antibiotic use.  She has not tried any over-the-counter medication for symptom management.  She is a smoker.  She is having difficulty with daily activities as result of symptoms.   Past Medical History:  Diagnosis Date   Depression     Patient Active Problem List   Diagnosis Date Noted   Women's annual routine gynecological examination 03/07/2021   Screen for STD (sexually transmitted disease) 03/07/2021   Pre-conception counseling 03/07/2021    History reviewed. No pertinent surgical history.  OB History     Gravida  0   Para  0   Term  0   Preterm  0   AB  0   Living  0      SAB  0   IAB  0   Ectopic  0   Multiple  0   Live Births  0            Home Medications    Prior to Admission medications   Medication Sig Start Date End Date Taking? Authorizing Provider  predniSONE (STERAPRED UNI-PAK 21 TAB) 10 MG (21) TBPK tablet As directed 04/02/21  Yes Phylliss Strege, Noberto Retort, PA-C    Family History Family History  Problem Relation Age of Onset   Diabetes Mother    Hypertension Mother    Healthy Father     Social History Social History   Tobacco Use   Smoking status: Every Day    Packs/day: 0.25     Types: Cigarettes   Smokeless tobacco: Current   Tobacco comments:    2-3 cigs/day  Vaping Use   Vaping Use: Every day  Substance Use Topics   Alcohol use: Yes    Comment: social   Drug use: Yes    Types: Marijuana     Allergies   Pertussis vaccines   Review of Systems Review of Systems  Constitutional:  Positive for activity change. Negative for appetite change, fatigue and fever.  HENT:  Negative for congestion, sinus pressure, sneezing and sore throat.   Respiratory:  Positive for cough, chest tightness and shortness of breath. Negative for wheezing.   Cardiovascular:  Negative for chest pain.  Gastrointestinal:  Negative for abdominal pain, diarrhea, nausea and vomiting.  Musculoskeletal:  Negative for arthralgias and myalgias.  Neurological:  Negative for dizziness, light-headedness and headaches.    Physical Exam Triage Vital Signs ED Triage Vitals  Enc Vitals Group     BP 04/02/21 1026 127/62     Pulse Rate 04/02/21 1026 84     Resp 04/02/21 1026 17     Temp 04/02/21 1026 98.5 F (36.9 C)  Temp Source 04/02/21 1026 Oral     SpO2 04/02/21 1026 100 %     Weight --      Height --      Head Circumference --      Peak Flow --      Pain Score 04/02/21 1033 0     Pain Loc --      Pain Edu? --      Excl. in GC? --    No data found.  Updated Vital Signs BP 127/62   Pulse 84   Temp 98.5 F (36.9 C) (Oral)   Resp 17   LMP 02/28/2021 (Exact Date)   SpO2 100%   Visual Acuity Right Eye Distance:   Left Eye Distance:   Bilateral Distance:    Right Eye Near:   Left Eye Near:    Bilateral Near:     Physical Exam Vitals reviewed.  Constitutional:      General: She is awake. She is not in acute distress.    Appearance: Normal appearance. She is well-developed. She is not ill-appearing.     Comments: Very pleasant female appears stated age in no acute distress  HENT:     Head: Normocephalic and atraumatic.     Right Ear: Tympanic membrane, ear canal  and external ear normal. Tympanic membrane is not erythematous or bulging.     Left Ear: Tympanic membrane, ear canal and external ear normal. Tympanic membrane is not erythematous or bulging.     Nose:     Right Sinus: No maxillary sinus tenderness or frontal sinus tenderness.     Left Sinus: No maxillary sinus tenderness or frontal sinus tenderness.     Mouth/Throat:     Pharynx: Uvula midline. No oropharyngeal exudate or posterior oropharyngeal erythema.  Cardiovascular:     Rate and Rhythm: Normal rate and regular rhythm.     Heart sounds: Normal heart sounds, S1 normal and S2 normal. No murmur heard. Pulmonary:     Effort: Pulmonary effort is normal.     Breath sounds: Normal breath sounds. No wheezing, rhonchi or rales.     Comments: Reactive cough with deep breathing Psychiatric:        Behavior: Behavior is cooperative.     UC Treatments / Results  Labs (all labs ordered are listed, but only abnormal results are displayed) Labs Reviewed - No data to display  EKG   Radiology No results found.  Procedures Procedures (including critical care time)  Medications Ordered in UC Medications  albuterol (VENTOLIN HFA) 108 (90 Base) MCG/ACT inhaler 2 puff (2 puffs Inhalation Given 04/02/21 1113)  methylPREDNISolone sodium succinate (SOLU-MEDROL) 125 mg/2 mL injection 80 mg (80 mg Intramuscular Given 04/02/21 1113)    Initial Impression / Assessment and Plan / UC Course  I have reviewed the triage vital signs and the nursing notes.  Pertinent labs & imaging results that were available during my care of the patient were reviewed by me and considered in my medical decision making (see chart for details).     Strongly suspect asthma exacerbation as etiology of symptoms triggered by URI versus cold exposure.  Patient had significant improvement of symptoms with Solu-Medrol and albuterol in clinic.  She was sent home on prednisone taper with instruction not to take NSAIDs with this  medication due to risk of GI bleeding.  She was given albuterol inhaler to be used every 4-6 hours as needed but we discussed that if she is having to use this regularly  she should return and we can consider a maintenance medication such as Symbicort.  No indication for viral testing given patient has been symptomatic for more than a week.  No evidence of acute infection that would warrant initiation of antibiotics.  Discussed alarm symptoms that warrant emergent evaluation.  Strict return precautions given to which she expressed understanding.  Final Clinical Impressions(s) / UC Diagnoses   Final diagnoses:  Mild intermittent reactive airway disease with acute exacerbation  Bronchitis  Acute cough     Discharge Instructions      I am concerned that you have the beginning of asthma.  Please start prednisone taper as we discussed to help with the symptoms.  While you are taking this, do not take NSAIDs including aspirin, ibuprofen/Advil, naproxen/Aleve as it can cause stomach bleeding.  You can use Tylenol if needed.  Use your albuterol inhaler every 4-6 hours as needed.  If you are having to use this regularly please return so we can start a maintenance medication.  If you have any severe symptoms including shortness of breath, cough, wheezing, symptoms not responding to albuterol you need to be reevaluated immediately.     ED Prescriptions     Medication Sig Dispense Auth. Provider   predniSONE (STERAPRED UNI-PAK 21 TAB) 10 MG (21) TBPK tablet As directed 21 tablet Rhiannan Kievit K, PA-C      PDMP not reviewed this encounter.   Jeani Hawking, PA-C 04/02/21 1132

## 2021-05-09 ENCOUNTER — Telehealth: Payer: BLUE CROSS/BLUE SHIELD | Admitting: Emergency Medicine

## 2021-05-09 DIAGNOSIS — K047 Periapical abscess without sinus: Secondary | ICD-10-CM

## 2021-05-09 MED ORDER — PENICILLIN V POTASSIUM 500 MG PO TABS: 500.0000 mg | ORAL_TABLET | Freq: Three times a day (TID) | ORAL | 0 refills | Status: AC

## 2021-05-09 NOTE — Patient Instructions (Signed)
°  April Bean, thank you for joining [name], NP for today's virtual visit.  While this provider is not your primary care provider (PCP), if your PCP is located in our provider database this encounter information will be shared with them immediately following your visit.  Consent: (Patient) April Bean provided verbal consent for this virtual visit at the beginning of the encounter.  Current Medications:  Current Outpatient Medications:    penicillin v potassium (VEETID) 500 MG tablet, Take 1 tablet (500 mg total) by mouth 3 (three) times daily for 10 days., Disp: 30 tablet, Rfl: 0   Medications ordered in this encounter:  Meds ordered this encounter  Medications   penicillin v potassium (VEETID) 500 MG tablet    Sig: Take 1 tablet (500 mg total) by mouth 3 (three) times daily for 10 days.    Dispense:  30 tablet    Refill:  0     *If you need refills on other medications prior to your next appointment, please contact your pharmacy*  Follow-Up: Call back or seek an in-person evaluation if the symptoms worsen or if the condition fails to improve as anticipated.  Other Instructions Please follow-up with a dentist to soon as possible.  There are some options listed below for dental care.  [facility] [phone] extension [phone]  Dr. Lawrence Marseilles [phone] [address]  [place] [phone] [address] [facility].  [facility] [phone] extension [phone] [place], [place], [address] First come first serve for the first 10 clients.  May do simple extractions only, no wisdom teeth or surgery.  You may try the second for Thursday of the month starting at 6:30 AM.  [facility] You may call the school to see if they are still helping to provide dental care for emergent cases.    If you have been instructed to have an in-person evaluation today at a local Urgent Care facility, please use the link below. It will take  you to a list of all of our available [facility], including address, phone number and hours of operation. Please do not delay care.  [facility]  If you or a family member do not have a primary care provider, use the link below to schedule a visit and establish care. When you choose a [facility] primary care physician or advanced practice provider, you gain a long-term partner in health. Find a Primary Care Provider  Learn more about [facility]'s in-office and virtual care options: [facility] - Get Care Now

## 2021-05-09 NOTE — Progress Notes (Signed)
Virtual Visit Consent   Marguarite Markov, you are scheduled for a virtual visit with a [facility] provider today.     Just as with appointments in the office, your consent must be obtained to participate.  Your consent will be active for this visit and any virtual visit you may have with one of our providers in the next 365 days.     If you have a MyChart account, a copy of this consent can be sent to you electronically.  All virtual visits are billed to your insurance company just like a traditional visit in the office.    As this is a virtual visit, video technology does not allow for your provider to perform a traditional examination.  This may limit your provider's ability to fully assess your condition.  If your provider identifies any concerns that need to be evaluated in person or the need to arrange testing (such as labs, EKG, etc.), we will make arrangements to do so.     Although advances in technology are sophisticated, we cannot ensure that it will always work on either your end or our end.  If the connection with a video visit is poor, the visit may have to be switched to a telephone visit.  With either a video or telephone visit, we are not always able to ensure that we have a secure connection.     I need to obtain your verbal consent now.   Are you willing to proceed with your visit today?    Nasiah Polinsky has provided verbal consent on 05/09/2021 for a virtual visit (video or telephone).   [name], NP   Date: 05/09/2021 12:22 PM   Virtual Visit via Video Note   I, [name], connected with  Leafy Ro  ([id number], [birth year]) on 05/09/21 at 12:15 PM EST by a video-enabled telemedicine application and verified that I am speaking with the correct person using two identifiers.  Location: Patient: Virtual Visit Location Patient: Other: in her car, parked. She is in [place] Provider: Virtual Visit Location Provider: Home Office   I discussed the limitations of evaluation  and management by telemedicine and the availability of in person appointments. The patient expressed understanding and agreed to proceed.    History of Present Illness: Selin Eisler is a 31 y.o. who identifies as a female who was assigned female at birth, and is being seen today for dental abscess.  She has had trouble with this tooth before and been told she needs a root canal.  However, she does not have dental insurance and cannot afford it.  She has had the abscess on her left lower gums towards the back near her molars for a week.  This morning it opened and pus drained out.  HPI: HPI  Problems:  Patient Active Problem List   Diagnosis Date Noted   Women's annual routine gynecological examination 03/07/2021   Screen for STD (sexually transmitted disease) 03/07/2021   Pre-conception counseling 03/07/2021    Allergies:  Allergies  Allergen Reactions   Pertussis Vaccines Hives   Medications:  Current Outpatient Medications:    penicillin v potassium (VEETID) 500 MG tablet, Take 1 tablet (500 mg total) by mouth 3 (three) times daily for 10 days., Disp: 30 tablet, Rfl: 0  Observations/Objective: Patient is well-developed, well-nourished in no acute distress.  Resting comfortably  in her car.  Head is normocephalic, atraumatic.  No labored breathing.  Speech is clear and coherent with logical content.  Patient  is alert and oriented at baseline.    Assessment and Plan: 1. Dental abscess  I prescribed penicillin.  Stressed to patient she needs to seek dental care or this is likely to happen again.  Follow Up Instructions: I discussed the assessment and treatment plan with the patient. The patient was provided an opportunity to ask questions and all were answered. The patient agreed with the plan and demonstrated an understanding of the instructions.  A copy of instructions were sent to the patient via MyChart unless otherwise noted below.   The patient was advised to call back or  seek an in-person evaluation if the symptoms worsen or if the condition fails to improve as anticipated.  Time:  I spent 8 minutes with the patient via telehealth technology discussing the above problems/concerns.    [name], NP

## 2021-08-23 ENCOUNTER — Emergency Department (HOSPITAL_COMMUNITY): Payer: Self-pay

## 2021-08-23 ENCOUNTER — Encounter (HOSPITAL_COMMUNITY): Payer: Self-pay

## 2021-08-23 ENCOUNTER — Emergency Department (HOSPITAL_COMMUNITY)
Admission: EM | Admit: 2021-08-23 | Discharge: 2021-08-23 | Disposition: A | Discharge status: Home / Self Care | Payer: Self-pay | Attending: Emergency Medicine | Admitting: Emergency Medicine

## 2021-08-23 ENCOUNTER — Other Ambulatory Visit: Payer: Self-pay

## 2021-08-23 DIAGNOSIS — R569 Unspecified convulsions: Secondary | ICD-10-CM | POA: Insufficient documentation

## 2021-08-23 DIAGNOSIS — F17210 Nicotine dependence, cigarettes, uncomplicated: Secondary | ICD-10-CM | POA: Insufficient documentation

## 2021-08-23 DIAGNOSIS — N9489 Other specified conditions associated with female genital organs and menstrual cycle: Secondary | ICD-10-CM | POA: Insufficient documentation

## 2021-08-23 DIAGNOSIS — F1290 Cannabis use, unspecified, uncomplicated: Secondary | ICD-10-CM | POA: Insufficient documentation

## 2021-08-23 LAB — COMPREHENSIVE METABOLIC PANEL
ALT: 16 U/L (ref 0–44)
ALT: 14 U/L (ref 0–44)
AST: 24 U/L (ref 15–41)
AST: 24 U/L (ref 15–41)
Albumin: 4.3 g/dL (ref 3.5–5.0)
Albumin: 4.3 g/dL (ref 3.5–5.0)
Alkaline Phosphatase: 46 U/L (ref 38–126)
Alkaline Phosphatase: 45 U/L (ref 38–126)
Anion gap: 6 (ref 5–15)
Anion gap: 7 (ref 5–15)
BUN: 7 mg/dL (ref 6–20)
BUN: <5 mg/dL — ABNORMAL LOW (ref 6–20)
CO2: 25 mmol/L (ref 22–32)
CO2: 22 mmol/L (ref 22–32)
Calcium: 9.2 mg/dL (ref 8.9–10.3)
Calcium: 9.4 mg/dL (ref 8.9–10.3)
Chloride: 102 mmol/L (ref 98–111)
Chloride: 106 mmol/L (ref 98–111)
Creatinine, Ser: 0.81 mg/dL (ref 0.44–1.00)
Creatinine, Ser: 0.85 mg/dL (ref 0.44–1.00)
GFR, Estimated: >60 mL/min (ref >60)
GFR, Estimated: >60 mL/min (ref >60)
Glucose, Bld: 106 mg/dL — ABNORMAL HIGH (ref 70–99)
Glucose, Bld: 96 mg/dL (ref 70–99)
Potassium: 4.3 mmol/L (ref 3.5–5.1)
Potassium: 4.5 mmol/L (ref 3.5–5.1)
Sodium: 133 mmol/L — ABNORMAL LOW (ref 135–145)
Sodium: 135 mmol/L (ref 135–145)
Total Bilirubin: 0.4 mg/dL (ref 0.3–1.2)
Total Bilirubin: 0.6 mg/dL (ref 0.3–1.2)
Total Protein: 7 g/dL (ref 6.5–8.1)
Total Protein: 7.1 g/dL (ref 6.5–8.1)

## 2021-08-23 LAB — CBC WITH DIFFERENTIAL/PLATELET
Abs Immature Granulocytes: 0.05 10*3/uL (ref 0.00–0.07)
Basophils Absolute: 0 10*3/uL (ref 0.0–0.1)
Basophils Relative: 0 %
Eosinophils Absolute: 0 10*3/uL (ref 0.0–0.5)
Eosinophils Relative: 0 %
HCT: 45.2 % (ref 36.0–46.0)
Hemoglobin: 14.8 g/dL (ref 12.0–15.0)
Immature Granulocytes: 0 %
Lymphocytes Relative: 9 %
Lymphs Abs: 1.2 10*3/uL (ref 0.7–4.0)
MCH: 27.7 pg (ref 26.0–34.0)
MCHC: 32.7 g/dL (ref 30.0–36.0)
MCV: 84.6 fL (ref 80.0–100.0)
Monocytes Absolute: 0.9 10*3/uL (ref 0.1–1.0)
Monocytes Relative: 6 %
Neutro Abs: 11.4 10*3/uL — ABNORMAL HIGH (ref 1.7–7.7)
Neutrophils Relative %: 85 %
Platelets: 271 10*3/uL (ref 150–400)
RBC: 5.34 MIL/uL — ABNORMAL HIGH (ref 3.87–5.11)
RDW: 13.6 % (ref 11.5–15.5)
WBC: 13.5 10*3/uL — ABNORMAL HIGH (ref 4.0–10.5)
nRBC: 0 % (ref 0.0–0.2)

## 2021-08-23 LAB — CBC
HCT: 43.1 % (ref 36.0–46.0)
Hemoglobin: 14.4 g/dL (ref 12.0–15.0)
MCH: 27.6 pg (ref 26.0–34.0)
MCHC: 33.4 g/dL (ref 30.0–36.0)
MCV: 82.6 fL (ref 80.0–100.0)
Platelets: 280 10*3/uL (ref 150–400)
RBC: 5.22 MIL/uL — ABNORMAL HIGH (ref 3.87–5.11)
RDW: 13.7 % (ref 11.5–15.5)
WBC: 10 10*3/uL (ref 4.0–10.5)
nRBC: 0 % (ref 0.0–0.2)

## 2021-08-23 LAB — URINALYSIS, ROUTINE W REFLEX MICROSCOPIC
Bilirubin Urine: NEGATIVE
Glucose, UA: NEGATIVE mg/dL
Hgb urine dipstick: NEGATIVE
Ketones, ur: 20 mg/dL — AB
Leukocytes,Ua: NEGATIVE
Nitrite: NEGATIVE
Protein, ur: NEGATIVE mg/dL
Specific Gravity, Urine: 1.011 (ref 1.005–1.030)
pH: 7 (ref 5.0–8.0)

## 2021-08-23 LAB — HCG, QUANTITATIVE, PREGNANCY: hCG, Beta Chain, Quant, S: 1 m[IU]/mL (ref <5)

## 2021-08-23 LAB — RAPID URINE DRUG SCREEN, HOSP PERFORMED
Amphetamines: NOT DETECTED
Barbiturates: NOT DETECTED
Benzodiazepines: POSITIVE — AB
Cocaine: NOT DETECTED
Opiates: NOT DETECTED
Tetrahydrocannabinol: POSITIVE — AB

## 2021-08-23 LAB — CBG MONITORING, ED
Glucose-Capillary: 99 mg/dL (ref 70–99)
Glucose-Capillary: 100 mg/dL — ABNORMAL HIGH (ref 70–99)

## 2021-08-23 LAB — ETHANOL: Alcohol, Ethyl (B): <10 mg/dL (ref <10)

## 2021-08-23 LAB — I-STAT VENOUS BLOOD GAS, ED
Acid-base deficit: 3 mmol/L — ABNORMAL HIGH (ref 0.0–2.0)
Bicarbonate: 23.3 mmol/L (ref 20.0–28.0)
Calcium, Ion: 1.19 mmol/L (ref 1.15–1.40)
HCT: 47 % — ABNORMAL HIGH (ref 36.0–46.0)
Hemoglobin: 16 g/dL — ABNORMAL HIGH (ref 12.0–15.0)
O2 Saturation: 63 %
Potassium: 4.6 mmol/L (ref 3.5–5.1)
Sodium: 136 mmol/L (ref 135–145)
TCO2: 25 mmol/L (ref 22–32)
pCO2, Ven: 46 mmHg (ref 44–60)
pH, Ven: 7.313 (ref 7.25–7.43)
pO2, Ven: 36 mmHg (ref 32–45)

## 2021-08-23 LAB — I-STAT BETA HCG BLOOD, ED (MC, WL, AP ONLY): I-stat hCG, quantitative: <5 m[IU]/mL (ref <5)

## 2021-08-23 LAB — SALICYLATE LEVEL: Salicylate Lvl: <7 mg/dL — ABNORMAL LOW (ref 7.0–30.0)

## 2021-08-23 LAB — CK: Total CK: 480 U/L — ABNORMAL HIGH (ref 38–234)

## 2021-08-23 LAB — ACETAMINOPHEN LEVEL: Acetaminophen (Tylenol), Serum: <10 ug/mL — ABNORMAL LOW (ref 10–30)

## 2021-08-23 MED ORDER — SODIUM CHLORIDE 0.9 % IV BOLUS
1000.0000 mL | Freq: Once | INTRAVENOUS | Status: AC
  Administered 2021-08-23: 1000 mL via INTRAVENOUS

## 2021-08-23 MED ORDER — KETOROLAC TROMETHAMINE 15 MG/ML IJ SOLN
15.0000 mg | Freq: Once | INTRAMUSCULAR | Status: AC
  Administered 2021-08-23: 15 mg via INTRAVENOUS
  Filled 2021-08-23: qty 1

## 2021-08-23 MED ORDER — PROCHLORPERAZINE EDISYLATE 10 MG/2ML IJ SOLN
10.0000 mg | Freq: Once | INTRAMUSCULAR | Status: AC
Start: 2021-08-23 — End: 2021-08-23
  Administered 2021-08-23: 10 mg via INTRAVENOUS
  Filled 2021-08-23: qty 2

## 2021-08-23 MED ORDER — POTASSIUM CHLORIDE CRYS ER 20 MEQ PO TBCR
40.0000 meq | EXTENDED_RELEASE_TABLET | Freq: Once | ORAL | Status: AC
  Administered 2021-08-23: 40 meq via ORAL
  Filled 2021-08-23: qty 2

## 2021-08-23 MED ORDER — DIPHENHYDRAMINE HCL 50 MG/ML IJ SOLN
25.0000 mg | Freq: Once | INTRAMUSCULAR | Status: AC
  Administered 2021-08-23: 25 mg via INTRAVENOUS
  Filled 2021-08-23: qty 1

## 2021-08-23 MED ORDER — LEVETIRACETAM IN NACL 1500 MG/100ML IV SOLN
1500.0000 mg | Freq: Once | INTRAVENOUS | Status: AC
Start: 2021-08-23 — End: 2021-08-23
  Administered 2021-08-23: 1500 mg via INTRAVENOUS
  Filled 2021-08-23: qty 100

## 2021-08-23 MED ORDER — LEVETIRACETAM 500 MG PO TABS: 500.0000 mg | ORAL_TABLET | Freq: Two times a day (BID) | ORAL | 0 refills | Status: DC

## 2021-08-23 NOTE — ED Provider Notes (Signed)
?[facility] Emergency Department ?Provider Note ?MRN:  [id number]  ?Arrival date & time: 08/23/21    ? ?Chief Complaint   ?Seizure ?History of Present Illness   ?April Bean is a 31 y.o. year-old female with no pertinent past medical history presenting to the ED with chief complaint of seizure. ? ?Patient was sleeping in bed with her significant other and significant other felt the bed shaking, got up to check on patient who was exhibiting seizure-like activity.  Tense, full body shaking, eyes deviated, biting her tongue.  This lasted 2 minutes followed by a period of acting very sleepy.  Patient does not remember these events.  Has never had a seizure before. ? ?Review of Systems  ?A thorough review of systems was obtained and all systems are negative except as noted in the HPI and PMH.  ? ?Patient's Health History   ? ?Past Medical History:  ?Diagnosis Date  ? Depression   ?  ?History reviewed. No pertinent surgical history.  ?Family History  ?Problem Relation Age of Onset  ? Diabetes Mother   ? Hypertension Mother   ? Healthy Father   ?  ?Social History  ? ?Socioeconomic History  ? Marital status: Single  ?  Spouse name: Not on file  ? Number of children: Not on file  ? Years of education: Not on file  ? Highest education level: Not on file  ?Occupational History  ? Not on file  ?Tobacco Use  ? Smoking status: Every Day  ?  Packs/day: 0.25  ?  Types: Cigarettes  ? Smokeless tobacco: Current  ? Tobacco comments:  ?  2-3 cigs/day  ?Vaping Use  ? Vaping Use: Every day  ?Substance and Sexual Activity  ? Alcohol use: Yes  ?  Comment: social  ? Drug use: Yes  ?  Types: Marijuana  ? Sexual activity: Not on file  ?Other Topics Concern  ? Not on file  ?Social History Narrative  ? Not on file  ? ?Social Determinants of Health  ? ?Financial Resource Strain: Not on file  ?Food Insecurity: No Food Insecurity  ? Worried About Programme researcher, broadcasting/film/video in the Last Year: Never true  ? Ran Out of Food in the  Last Year: Never true  ?Transportation Needs: Unmet Transportation Needs  ? Lack of Transportation (Medical): Yes  ? Lack of Transportation (Non-Medical): Yes  ?Physical Activity: Not on file  ?Stress: Not on file  ?Social Connections: Not on file  ?Intimate Partner Violence: Not on file  ?  ? ?Physical Exam  ? ?Vitals:  ? 08/23/21 0305  ?BP: (!) 132/94  ?Pulse: 61  ?Resp: 16  ?Temp: (!) 97.2 ?F (36.2 ?C)  ?SpO2: 97%  ?  ?CONSTITUTIONAL: Well-appearing, NAD ?NEURO/PSYCH:  Alert and oriented x 3, normal and symmetric strength and sensation, normal coordination, normal speech ?EYES:  eyes equal and reactive ?ENT/NECK:  no LAD, no JVD; small abrasions to lateral tongue bilaterally ?CARDIO: Regular rate, well-perfused, normal S1 and S2 ?PULM:  CTAB no wheezing or rhonchi ?GI/GU:  non-distended, non-tender ?MSK/SPINE:  No gross deformities, no edema ?SKIN:  no rash, atraumatic ? ? ?*Additional and/or pertinent findings included in MDM below ? ?Diagnostic and Interventional Summary  ? ? EKG Interpretation ? ?Date/Time:  Tuesday August 23 2021 04:34:33 EDT ?Ventricular Rate:  61 ?PR Interval:  170 ?QRS Duration: 80 ?QT Interval:  382 ?QTC Calculation: 384 ?R Axis:   86 ?Text Interpretation: Normal sinus rhythm Normal ECG No previous ECGs  available Confirmed by Kennis Carina [id number]) on 08/23/2021 5:37:44 AM ?  ? ?  ? ?Labs Reviewed  ?CBC - Abnormal; Notable for the following components:  ?    Result Value  ? RBC 5.22 (*)   ? All other components within normal limits  ?COMPREHENSIVE METABOLIC PANEL - Abnormal; Notable for the following components:  ? Sodium 133 (*)   ? Glucose, Bld 106 (*)   ? All other components within normal limits  ?HCG, QUANTITATIVE, PREGNANCY  ?CBG MONITORING, ED  ?  ?CT HEAD WO CONTRAST ( )  ?Final Result  ?  ?  ?Medications  ?potassium chloride SA (KLOR-CON M) CR tablet 40 mEq (has no administration in time range)  ?sodium chloride 0.9 % bolus 1,000 mL (1,000 mLs Intravenous New Bag/Given 08/23/21  0436)  ?diphenhydrAMINE (BENADRYL) injection 25 mg (25 mg Intravenous Given 08/23/21 0437)  ?prochlorperazine (COMPAZINE) injection 10 mg (10 mg Intravenous Given 08/23/21 0441)  ?  ? ?Procedures  /  Critical Care ?Procedures ? ?ED Course and Medical Decision Making  ?Initial Impression and Ddx ?See seizure this evening, no prior history, DDx includes primary seizure disorder, intracranial mass, electrolyte disturbance, awaiting work-up, CT head.  Patient with a normal neurological exam at this time. ? ?Past medical/surgical history that increases complexity of ED encounter: None ? ?Interpretation of Diagnostics ?I personally reviewed the EKG and my interpretation is as follows: Sinus rhythm ?   ?Labs reveal no significant blood count disturbance, mild hypokalemia, no other electrolyte disturbance.  hCG negative.  CT head is without acute process. ? ?Patient Reassessment and Ultimate Disposition/Management ?Patient continues to look and feel well with no further seizure activity, appropriate for discharge with neurology follow-up. ? ?Patient management required discussion with the following services or consulting groups:  None ? ?Complexity of Problems Addressed ?Acute illness or injury that poses threat of life of bodily function ? ?Additional Data Reviewed and Analyzed ?Further history obtained from: ?Further history from spouse/family member ? ?Additional Factors Impacting ED Encounter Risk ?None ? ?Elmer Sow. Pilar Plate, MD ?[facility] Emergency Medicine ?[facility] [email or web address] ? ?Final Clinical Impressions(s) / ED Diagnoses  ? ?  ICD-10-CM   ?1. Seizure (HCC)  R56.9   ?  ?  ?ED Discharge Orders   ? ?      Ordered  ?  Ambulatory referral to Neurology       ?Comments: An appointment is requested in approximately: 1 week  ? 08/23/21 0612  ? ?  ?  ? ?  ?  ? ?Discharge Instructions Discussed with and Provided to Patient:  ? ? ? ?Discharge Instructions   ? ?  ?You were evaluated in the Emergency  Department and after careful evaluation, we did not find any emergent condition requiring admission or further testing in the hospital. ? ?Your exam/testing today is overall reassuring.  Symptoms likely due to a seizure.  Important that you follow-up with a neurologist for continued management/testing.  We discussed you should not drive or do anything that would be dangerous if you had a seizure during the activity. ? ?Please return to the Emergency Department if you experience any worsening of your condition.   Thank you for allowing [place] to be a part of your care. ? ? ? ? ?  ?Sabas Sous, MD ?08/23/21 [phone] ? ?

## 2021-08-23 NOTE — ED Triage Notes (Signed)
Pt BIB [facility] from home with mother witnessed her having what seemed to be a seizure. Blood was drooling from her mouth & pot was c/o a bad HA prior to the event. Directly afterwards she was very confused & EMS stated was combative. While en route she was administered 5mg  IM Haldol, 2.5 mg ativan via PIV that pt later pulled out of her arm & another 2.5 mg Ativan via IM. 176 CBG, 76 bpm, 120/76, resp 28. Pt very lethargic d/t meds, respond to touch & voice, still remains confused upon arrival.  ?

## 2021-08-23 NOTE — ED Provider Notes (Signed)
?[facility] ?Provider Note ? ? ?CSN: [id number] ?Arrival date & time: 08/23/21  0908 ? ?  ? ?History ? ?Chief Complaint  ?Patient presents with  ? Possible Seizure  ? ? ?April Bean is a 31 y.o. female. ? ? Patient as above with significant medical history as below, including depression, anxiety who presents to the ED with complaint of abnormal behavior, possible seizure. ? ?Patient was seen earlier in the day with similar complaint.  Per chart review she had reported 2 minutes of seizure-like activity with postictal period.  Patient received laboratory evaluation and CT imaging while in the ED earlier today which was negative.  Per the mother patient got home this morning, around 8 AM she began to complain of headache, starting acting abnormally, start moving her arms in a jerking manner.  Speaking gibberish, becoming aggressive.  Mother called EMS, patient was given 5 mg of haldol initially, Versed.  Patient more compliant with care at that time.  Mother was worried the patient had blood coming from mouth, concerned possibly she bit her tongue.  Patient did not fall to the ground, no head injury was reported.  No urinary incontinence.  No emesis.  Mother denies any history of illicit drug use, no alcohol use.  Mother was concerned that possibly this event was provoked by extreme anxiety.  ? ?Patient unable to provide significant history, she is sedated after medications administered by EMS.   ? ?Pt asking for blankets, asking [place] to leave her alone  ? ? ?Past Medical History: ?No date: Depression ? ?No past surgical history on file.  ? ? ?The history is provided by the patient and a parent. No language interpreter was used.  ? ?  ? ?Home Medications ?Prior to Admission medications   ?Medication Sig Start Date End Date Taking? Authorizing Provider  ?levETIRAcetam (KEPPRA) 500 MG tablet Take 1 tablet (500 mg total) by mouth 2 (two) times daily. 08/23/21 09/22/21 Yes Jeanell Sparrow, DO  ?   ? ?Allergies    ?Pertussis vaccines   ? ?Review of Systems   ?Review of Systems  ?Unable to perform ROS: Mental status change  ? ?Physical Exam ?Updated Vital Signs ?BP 125/73   Pulse 96   Temp 100 ?F (37.8 ?C) (Oral)   Resp (!) 23   LMP 08/09/2021 (Approximate)   SpO2 98%  ?Physical Exam ?Vitals and nursing note reviewed. Exam conducted with a chaperone present.  ?Constitutional:   ?   General: She is not in acute distress. ?   Appearance: Normal appearance.  ?HENT:  ?   Head: Normocephalic and atraumatic. No raccoon eyes, Battle's sign, right periorbital erythema or left periorbital erythema.  ?   Right Ear: External ear normal.  ?   Left Ear: External ear normal.  ?   Nose: Nose normal.  ?   Mouth/Throat:  ?   Mouth: Mucous membranes are moist.  ?Eyes:  ?   General: No scleral icterus.    ?   Right eye: No discharge.     ?   Left eye: No discharge.  ?   Extraocular Movements: Extraocular movements intact.  ?   Pupils: Pupils are equal, round, and reactive to light.  ?Cardiovascular:  ?   Rate and Rhythm: Normal rate and regular rhythm.  ?   Pulses: Normal pulses.  ?   Heart sounds: Normal heart sounds.  ?Pulmonary:  ?   Effort: Pulmonary effort is normal. No respiratory distress.  ?  Breath sounds: Normal breath sounds.  ?Abdominal:  ?   General: Abdomen is flat.  ?   Tenderness: There is no abdominal tenderness.  ?Musculoskeletal:     ?   General: Normal range of motion.  ?   Cervical back: Full passive range of motion without pain and normal range of motion.  ?   Right lower leg: No edema.  ?   Left lower leg: No edema.  ?Skin: ?   General: Skin is warm and dry.  ?   Capillary Refill: Capillary refill takes less than 2 seconds.  ?Neurological:  ?   Mental Status: She is alert and oriented to person, place, and time.  ?   GCS: GCS eye subscore is 4. GCS verbal subscore is 5. GCS motor subscore is 6.  ?   Cranial Nerves: Cranial nerves 2-12 are intact.  ?   Sensory: Sensation is intact.  ?    Motor: Motor function is intact. No tremor.  ?   Coordination: Coordination is intact. Finger-Nose-Finger Test normal.  ?   Gait: Gait is intact.  ?   Comments: She is not compliant with neurologic testing, she is moving all 4 extremities spontaneously.  No clonus.  When asked to complete testing she responds with "no, leave me alone" ? ?She did follow simple commands initially ? ?On repeat assessment she is able to complete exam, she is neurologically intact, at baseline per spouse @ bedside  ?Psychiatric:     ?   Mood and Affect: Mood normal.     ?   Behavior: Behavior normal.  ? ? ?ED Results / Procedures / Treatments   ?Labs ?(all labs ordered are listed, but only abnormal results are displayed) ?Labs Reviewed  ?CBC WITH DIFFERENTIAL/PLATELET - Abnormal; Notable for the following components:  ?    Result Value  ? WBC 13.5 (*)   ? RBC 5.34 (*)   ? Neutro Abs 11.4 (*)   ? All other components within normal limits  ?COMPREHENSIVE METABOLIC PANEL - Abnormal; Notable for the following components:  ? BUN <5 (*)   ? All other components within normal limits  ?URINALYSIS, ROUTINE W REFLEX MICROSCOPIC - Abnormal; Notable for the following components:  ? Color, Urine STRAW (*)   ? Ketones, ur 20 (*)   ? All other components within normal limits  ?CK - Abnormal; Notable for the following components:  ? Total CK 480 (*)   ? All other components within normal limits  ?ACETAMINOPHEN LEVEL - Abnormal; Notable for the following components:  ? Acetaminophen (Tylenol), Serum <10 (*)   ? All other components within normal limits  ?SALICYLATE LEVEL - Abnormal; Notable for the following components:  ? Salicylate Lvl [id number] (*)   ? All other components within normal limits  ?RAPID URINE DRUG SCREEN, HOSP PERFORMED - Abnormal; Notable for the following components:  ? Benzodiazepines POSITIVE (*)   ? Tetrahydrocannabinol POSITIVE (*)   ? All other components within normal limits  ?CBG MONITORING, ED - Abnormal; Notable for the following  components:  ? Glucose-Capillary 100 (*)   ? All other components within normal limits  ?I-STAT VENOUS BLOOD GAS, ED - Abnormal; Notable for the following components:  ? Acid-base deficit 3.0 (*)   ? HCT 47.0 (*)   ? Hemoglobin 16.0 (*)   ? All other components within normal limits  ?ETHANOL  ?I-STAT BETA HCG BLOOD, ED ([facility], [facility], [facility] ONLY)  ? ? ?EKG ?EKG Interpretation ? ?Date/Time:  Tuesday  August 23 2021 09:27:09 EDT ?Ventricular Rate:  85 ?PR Interval:  174 ?QRS Duration: 81 ?QT Interval:  328 ?QTC Calculation: 390 ?R Axis:   88 ?Text Interpretation: Sinus rhythm Consider right atrial enlargement similar to prior no stemi Confirmed by Wynona Dove ([id number]) on 08/23/2021 9:31:23 AM ? ?Radiology ?CT Head Wo Contrast ? ?Result Date: 08/23/2021 ?CLINICAL DATA:  Mental status change, unknown cause ?seizure EXAM: CT HEAD WITHOUT CONTRAST TECHNIQUE: Contiguous axial images were obtained from the base of the skull through the vertex without intravenous contrast. RADIATION DOSE REDUCTION: This exam was performed according to the departmental dose-optimization program which includes automated exposure control, adjustment of the mA and/or kV according to patient size and/or use of iterative reconstruction technique. COMPARISON:  CT head the same day. FINDINGS: Brain: No evidence of acute infarction, hemorrhage, hydrocephalus, extra-axial collection or mass lesion/mass effect. Vascular: No evidence vessel identified. Skull: No acute fracture. Sinuses/Orbits: Clear sinuses.  No acute orbital findings. 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 No mastoid effusions. IMPRESSION: No evidence of acute intracranial abnormality. Seizure protocol MRI could further evaluate if clinically warranted. Electronically Signed   By: Margaretha Sheffield M.D.   On: 08/23/2021 10:39   ? ?Procedures ?Procedures  ? ? ?Medications Ordered in ED ?Medications  ?levETIRAcetam (KEPPRA) IVPB 1500 mg/ 100 mL premix (0 mg Intravenous Stopped 08/23/21  1059)  ?sodium chloride 0.9 % bolus 1,000 mL (0 mLs Intravenous Stopped 08/23/21 1516)  ?ketorolac (TORADOL) 15 MG/ML injection 15 mg (15 mg Intravenous Given 08/23/21 1228)  ? ? ?ED Course/ Medical Decision [facility]

## 2021-08-23 NOTE — ED Triage Notes (Signed)
Pt arrived from home via [facility] w c/o seizure. Zero hx of sz. Pt wife called 911. Sz approx 2 min. Pt does not remember anything ?

## 2021-08-23 NOTE — Discharge Instructions (Addendum)
It was a pleasure caring for you today in the emergency department. ? ?Please return to the emergency department for any worsening or worrisome symptoms. ? ?Please refuse from marijuana use in the future  ? ?Please follow up with neurology  ? ? ?Per [facility] statutes, patients with seizures are not allowed to drive until they have been seizure-free for six months.  Other recommendations include using caution when using heavy equipment or power tools. Avoid working on ladders or at heights. Take showers instead of baths.  Do not swim alone.  Ensure the water temperature is not too high on the home water heater. Do not go swimming alone. Do not lock yourself in a room alone (i.e. bathroom). When caring for infants or small children, sit down when holding, feeding, or changing them to minimize risk of injury to the child in the event you have a seizure. Maintain good sleep hygiene. Avoid alcohol.  Also recommend adequate sleep, hydration, good diet and minimize stress. ?  ?  ?During the Seizure ?  ?- First, ensure adequate ventilation and place patients on the floor on their left side  ?Loosen clothing around the neck and ensure the airway is patent. If the patient is clenching the teeth, do not force the mouth open with any object as this can cause severe damage ?- Remove all items from the surrounding that can be hazardous. The patient may be oblivious to what's happening and may not even know what he or she is doing. ?If the patient is confused and wandering, either gently guide him/her away and block access to outside areas ?- Reassure the individual and be comforting ?- Call 911. In most cases, the seizure ends before EMS arrives. However, there are cases when seizures may last over 3 to 5 minutes. Or the individual may have developed breathing difficulties or severe injuries. If a pregnant patient or a person with diabetes develops a seizure, it is prudent to call an ambulance. ?- Finally, if the  patient does not regain full consciousness, then call EMS. Most patients will remain confused for about 45 to 90 minutes after a seizure, so you must use judgment in calling for help. ?- Avoid restraints but make sure the patient is in a bed with padded side rails ?- Place the individual in a lateral position with the neck slightly flexed; this will help the saliva drain from the mouth and prevent the tongue from falling backward ?- Remove all nearby furniture and other hazards from the area ?- Provide verbal assurance as the individual is regaining consciousness ?- Provide the patient with privacy if possible ?- Call for help and start treatment as ordered by the caregiver ?  ?After the Seizure (Postictal Stage) ?  ?After a seizure, most patients experience confusion, fatigue, muscle pain and/or a headache. Thus, one should permit the individual to sleep. For the next few days, reassurance is essential. Being calm and helping reorient the person is also of importance. ?  ?Most seizures are painless and end spontaneously. Seizures are not harmful to others but can lead to complications such as stress on the lungs, brain and the heart. Individuals with prior lung problems may develop labored breathing and respiratory distress.   ? ?

## 2021-08-23 NOTE — Discharge Instructions (Signed)
You were evaluated in the Emergency Department and after careful evaluation, we did not find any emergent condition requiring admission or further testing in the hospital. ? ?Your exam/testing today is overall reassuring.  Symptoms likely due to a seizure.  Important that you follow-up with a neurologist for continued management/testing.  We discussed you should not drive or do anything that would be dangerous if you had a seizure during the activity. ? ?Please return to the Emergency Department if you experience any worsening of your condition.   Thank you for allowing [place] to be a part of your care. ?

## 2021-08-23 NOTE — ED Notes (Signed)
Pt's family was called to come pick her up for D/C. ?

## 2021-08-27 ENCOUNTER — Telehealth: Payer: Self-pay | Admitting: Nurse Practitioner

## 2021-08-27 DIAGNOSIS — N898 Other specified noninflammatory disorders of vagina: Secondary | ICD-10-CM

## 2021-08-27 NOTE — Progress Notes (Signed)
Based on what you shared with me it looks like you have vaginal discharge,that should be evaluated in a face to face office visit. Yeast and bacterial vaginosis , that can be treated in an evisit, do not have a greenish yellow discoloration. A sample of your discharge needs to be looked at evaluated for proper treatment. ? ?NOTE: There will be NO CHARGE for this eVisit ?  ?If you are having a true medical emergency please call 911.   ?  ? For an urgent face to face visit, [facility] has six urgent care centers for your convenience:  ?  ? [facility] ?Get Driving Directions ?[phone] ?[address] ?[place], [place] [address] ?  ? [facility] [place]) ?Get Driving Directions ?[phone] ?[address] ?[place], [place] [address] ? ?[facility] [place] ([place]) ?Get Driving Directions ?[place],  [place]  [address] ? ?[facility] ?Get Driving Directions ?[phone] ?[address], [address] ?[place], [place] [address] ?  ?[facility] [place] ?Get Driving Directions  ?[phone] ?[address] ?[address] ?[place], [place] [address] ?  ?[facility] ?Get Driving Directions ?[phone] ?[address] ?[place], [place] [address] ? ?Your MyChart E-visit questionnaire answers were reviewed by a board certified advanced clinical practitioner to complete your personal care plan based on your specific symptoms.  Thank you for using e-Visits. ?  ? ?

## 2021-09-13 ENCOUNTER — Inpatient Hospital Stay: Payer: Self-pay | Admitting: Neurology

## 2021-11-21 ENCOUNTER — Telehealth: Payer: Self-pay | Admitting: Physician Assistant

## 2021-11-21 DIAGNOSIS — N760 Acute vaginitis: Secondary | ICD-10-CM

## 2021-11-21 DIAGNOSIS — B3731 Acute candidiasis of vulva and vagina: Secondary | ICD-10-CM

## 2021-11-21 DIAGNOSIS — B9689 Other specified bacterial agents as the cause of diseases classified elsewhere: Secondary | ICD-10-CM

## 2021-11-21 MED ORDER — METRONIDAZOLE 500 MG PO TABS: 500.0000 mg | ORAL_TABLET | Freq: Two times a day (BID) | ORAL | 0 refills | Status: AC

## 2021-11-21 MED ORDER — FLUCONAZOLE 150 MG PO TABS: 150.0000 mg | ORAL_TABLET | ORAL | 0 refills | Status: DC | PRN

## 2021-11-21 NOTE — Patient Instructions (Signed)
April Bean, thank you for joining Mar Daring, PA-C for today's virtual visit.  While this provider is not your primary care provider (PCP), if your PCP is located in our provider database this encounter information will be shared with them immediately following your visit.  Consent: (Patient) April Bean provided verbal consent for this virtual visit at the beginning of the encounter.  Current Medications:  Current Outpatient Medications:    fluconazole (DIFLUCAN) 150 MG tablet, Take 1 tablet (150 mg total) by mouth every 3 (three) days as needed., Disp: 2 tablet, Rfl: 0   metroNIDAZOLE (FLAGYL) 500 MG tablet, Take 1 tablet (500 mg total) by mouth 2 (two) times daily for 7 days., Disp: 14 tablet, Rfl: 0   levETIRAcetam (KEPPRA) 500 MG tablet, Take 1 tablet (500 mg total) by mouth 2 (two) times daily., Disp: 60 tablet, Rfl: 0   Medications ordered in this encounter:  Meds ordered this encounter  Medications   metroNIDAZOLE (FLAGYL) 500 MG tablet    Sig: Take 1 tablet (500 mg total) by mouth 2 (two) times daily for 7 days.    Dispense:  14 tablet    Refill:  0    Order Specific Question:   Supervising Provider    Answer:   MILLER, BRIAN [[id number]]   fluconazole (DIFLUCAN) 150 MG tablet    Sig: Take 1 tablet (150 mg total) by mouth every 3 (three) days as needed.    Dispense:  2 tablet    Refill:  0    Order Specific Question:   Supervising Provider    Answer:   Sabra Heck, BRIAN [[id number]]     *If you need refills on other medications prior to your next appointment, please contact your pharmacy*  Follow-Up: Call back or seek an in-person evaluation if the symptoms worsen or if the condition fails to improve as anticipated.  Other Instructions Bacterial Vaginosis  Bacterial vaginosis is an infection of the vagina. It happens when too many normal germs (healthy bacteria) grow in the vagina. This infection can make it easier to get other infections from sex (STIs). It is very  important for pregnant women to get treated. This infection can cause babies to be born early or at a low birth weight. What are the causes? This infection is caused by an increase in certain germs that grow in the vagina. You cannot get this infection from toilet seats, bedsheets, swimming pools, or things that touch your vagina. What increases the risk? Having sex with a new person or more than one person. Having sex without protection. Douching. Having an intrauterine device (IUD). Smoking. Using drugs or drinking alcohol. These can lead you to do things that are risky. Taking certain antibiotic medicines. Being pregnant. What are the signs or symptoms? Some women have no symptoms. Symptoms may include: A discharge from your vagina. It may be gray or white. It can be watery or foamy. A fishy smell. This can happen after sex or during your menstrual period. Itching in and around your vagina. A feeling of burning or pain when you pee (urinate). How is this treated? This infection is treated with antibiotic medicines. These may be given to you as: A pill. A cream for your vagina. A medicine that you put into your vagina (suppository). If the infection comes back after treatment, you may need more antibiotics. Follow these instructions at home: Medicines Take over-the-counter and prescription medicines as told by your doctor. Take or use your antibiotic medicine as told by  your doctor. Do not stop taking or using it, even if you start to feel better. General instructions If the person you have sex with is a woman, tell her that you have this infection. She will need to follow up with her doctor. If you have a female partner, he does not need to be treated. Do not have sex until you finish treatment. Drink enough fluid to keep your pee pale yellow. Keep your vagina and butt clean. Wash the area with warm water each day. Wipe from front to back after you use the toilet. If you are  breastfeeding a baby, ask your doctor if you should keep doing so during treatment. Keep all follow-up visits. How is this prevented? Self-care Do not douche. Use only warm water to wash around your vagina. Wear underwear that is cotton or lined with cotton. Do not wear tight pants and pantyhose, especially in the summer. Safe sex Use protection when you have sex. This includes: Use condoms. Use dental dams. This is a thin layer that protects the mouth during oral sex. Limit how many people you have sex with. To prevent this infection, it is best to have sex with just one person. Get tested for STIs. The person you have sex with should also get tested. Drugs and alcohol Do not smoke or use any products that contain nicotine or tobacco. If you need help quitting, ask your doctor. Do not use drugs. Do not drink alcohol if: Your doctor tells you not to drink. You are pregnant, may be pregnant, or are planning to become pregnant. If you drink alcohol: Limit how much you have to 0-1 drink a day. Know how much alcohol is in your drink. In the [place] one drink equals one 12 oz bottle of beer (355 mL), one 5 oz glass of wine (148 mL), or one 1 oz glass of hard liquor (44 mL). Where to find more information Centers for Disease Control and Prevention: FootballExhibition.com.br American Sexual Health Association: www.ashastd.org Office on [employer] Health: http://hoffman.com/ Contact a doctor if: Your symptoms do not get better, even after you are treated. You have more discharge or pain when you pee. You have a fever or chills. You have pain in your belly (abdomen) or in the area between your hips. You have pain with sex. You bleed from your vagina between menstrual periods. Summary This infection can happen when too many germs (bacteria) grow in the vagina. This infection can make it easier to get infections from sex (STIs). Treating this can lower that chance. Get treated if you are pregnant. This  infection can cause babies to be born early. Do not stop taking or using your antibiotic medicine, even if you start to feel better. This information is not intended to replace advice given to you by your health care provider. Make sure you discuss any questions you have with your health care provider. Document Revised: 10/16/2019 Document Reviewed: 10/16/2019 Elsevier Patient Education  2023 Elsevier Inc.   Vaginal Yeast Infection, Adult  Vaginal yeast infection is a condition that causes vaginal discharge as well as soreness, swelling, and redness (inflammation) of the vagina. This is a common condition. Some women get this infection frequently. What are the causes? This condition is caused by a change in the normal balance of the yeast (Candida) and normal bacteria that live in the vagina. This change causes an overgrowth of yeast, which causes the inflammation. What increases the risk? The condition is more likely to develop in women  who: Take antibiotic medicines. Have diabetes. Take birth control pills. Are pregnant. Douche often. Have a weak body defense system (immune system). Have been taking steroid medicines for a long time. Frequently wear tight clothing. What are the signs or symptoms? Symptoms of this condition include: White, thick, creamy vaginal discharge. Swelling, itching, redness, and irritation of the vagina. The lips of the vagina (labia) may be affected as well. Pain or a burning feeling while urinating. Pain during sex. How is this diagnosed? This condition is diagnosed based on: Your medical history. A physical exam. A pelvic exam. Your health care provider will examine a sample of your vaginal discharge under a microscope. Your health care provider may send this sample for testing to confirm the diagnosis. How is this treated? This condition is treated with medicine. Medicines may be over-the-counter or prescription. You may be told to use one or more of  the following: Medicine that is taken by mouth (orally). Medicine that is applied as a cream (topically). Medicine that is inserted directly into the vagina (suppository). Follow these instructions at home: Take or apply over-the-counter and prescription medicines only as told by your health care provider. Do not use tampons until your health care provider approves. Do not have sex until your infection has cleared. Sex can prolong or worsen your symptoms of infection. Ask your health care provider when it is safe to resume sexual activity. Keep all follow-up visits. This is important. How is this prevented?  Do not wear tight clothes, such as pantyhose or tight pants. Wear breathable cotton underwear. Do not use douches, perfumed soap, creams, or powders. Wipe from front to back after using the toilet. If you have diabetes, keep your blood sugar levels under control. Ask your health care provider for other ways to prevent yeast infections. Contact a health care provider if: You have a fever. Your symptoms go away and then return. Your symptoms do not get better with treatment. Your symptoms get worse. You have new symptoms. You develop blisters in or around your vagina. You have blood coming from your vagina and it is not your menstrual period. You develop pain in your abdomen. Summary Vaginal yeast infection is a condition that causes discharge as well as soreness, swelling, and redness (inflammation) of the vagina. This condition is treated with medicine. Medicines may be over-the-counter or prescription. Take or apply over-the-counter and prescription medicines only as told by your health care provider. Do not douche. Resume sexual activity or use of tampons as instructed by your health care provider. Contact a health care provider if your symptoms do not get better with treatment or your symptoms go away and then return. This information is not intended to replace advice given to  you by your health care provider. Make sure you discuss any questions you have with your health care provider. Document Revised: 07/05/2020 Document Reviewed: 07/05/2020 Elsevier Patient Education  2023 Elsevier Inc.    If you have been instructed to have an in-person evaluation today at a local Urgent Care facility, please use the link below. It will take you to a list of all of our available [facility], including address, phone number and hours of operation. Please do not delay care.  [facility]  If you or a family member do not have a primary care provider, use the link below to schedule a visit and establish care. When you choose a [facility] primary care physician or advanced practice provider, you gain  a long-term partner in health. Find a Primary Care Provider  Learn more about [facility]'s in-office and virtual care options: [facility] - Get Care Now

## 2021-11-21 NOTE — Progress Notes (Signed)
Virtual Visit Consent   April Bean, you are scheduled for a virtual visit with a [facility] provider today. Just as with appointments in the office, your consent must be obtained to participate. Your consent will be active for this visit and any virtual visit you may have with one of our providers in the next 365 days. If you have a MyChart account, a copy of this consent can be sent to you electronically.  As this is a virtual visit, video technology does not allow for your provider to perform a traditional examination. This may limit your provider's ability to fully assess your condition. If your provider identifies any concerns that need to be evaluated in person or the need to arrange testing (such as labs, EKG, etc.), we will make arrangements to do so. Although advances in technology are sophisticated, we cannot ensure that it will always work on either your end or our end. If the connection with a video visit is poor, the visit may have to be switched to a telephone visit. With either a video or telephone visit, we are not always able to ensure that we have a secure connection.  By engaging in this virtual visit, you consent to the provision of healthcare and authorize for your insurance to be billed (if applicable) for the services provided during this visit. Depending on your insurance coverage, you may receive a charge related to this service.  I need to obtain your verbal consent now. Are you willing to proceed with your visit today? Nada Godley has provided verbal consent on 11/21/2021 for a virtual visit (video or telephone). Margaretann Loveless, PA-C  Date: 11/21/2021 9:49 AM  Virtual Visit via Video Note   I, Margaretann Loveless, connected with  Leafy Ro  ([id number], 12-10-1990) on 11/21/21 at  9:45 AM EDT by a video-enabled telemedicine application and verified that I am speaking with the correct person using two identifiers.  Location: Patient: Virtual Visit Location Patient:  Mobile Provider: Virtual Visit Location Provider: Home Office   I discussed the limitations of evaluation and management by telemedicine and the availability of in person appointments. The patient expressed understanding and agreed to proceed.    History of Present Illness: April Bean is a 31 y.o. who identifies as a female who was assigned female at birth, and is being seen today for possible yeast infection.  HPI: Vaginal Discharge The patient's primary symptoms include genital itching, a genital odor and vaginal discharge. This is a new problem. The current episode started in the past 7 days. The problem occurs constantly. The problem has been gradually worsening. The pain is mild. Pertinent negatives include no back pain, chills, fever, nausea or vomiting. The vaginal discharge was thick, white and malodorous. Nothing aggravates the symptoms. She has tried nothing for the symptoms. The treatment provided no relief.      Problems:  Patient Active Problem List   Diagnosis Date Noted   Women's annual routine gynecological examination 03/07/2021   Screen for STD (sexually transmitted disease) 03/07/2021   Pre-conception counseling 03/07/2021    Allergies:  Allergies  Allergen Reactions   Pertussis Vaccines Hives   Medications:  Current Outpatient Medications:    fluconazole (DIFLUCAN) 150 MG tablet, Take 1 tablet (150 mg total) by mouth every 3 (three) days as needed., Disp: 2 tablet, Rfl: 0   metroNIDAZOLE (FLAGYL) 500 MG tablet, Take 1 tablet (500 mg total) by mouth 2 (two) times daily for 7 days., Disp: 14 tablet, Rfl: 0  levETIRAcetam (KEPPRA) 500 MG tablet, Take 1 tablet (500 mg total) by mouth 2 (two) times daily., Disp: 60 tablet, Rfl: 0  Observations/Objective: Patient is well-developed, well-nourished in no acute distress.  Resting comfortably  Head is normocephalic, atraumatic.  No labored breathing.  Speech is clear and coherent with logical content.  Patient is  alert and oriented at baseline.    Assessment and Plan: 1. BV (bacterial vaginosis) - metroNIDAZOLE (FLAGYL) 500 MG tablet; Take 1 tablet (500 mg total) by mouth 2 (two) times daily for 7 days.  Dispense: 14 tablet; Refill: 0  2. Yeast vaginitis - fluconazole (DIFLUCAN) 150 MG tablet; Take 1 tablet (150 mg total) by mouth every 3 (three) days as needed.  Dispense: 2 tablet; Refill: 0  - Suspect BV and yeast infection - Metronidazole and diflucan prescribed - Avoid bubble baths, scented soaps and lotions - Use free and clear laundry detergent - Seek in person evaluation if not improving or if symptoms worsen  Follow Up Instructions: I discussed the assessment and treatment plan with the patient. The patient was provided an opportunity to ask questions and all were answered. The patient agreed with the plan and demonstrated an understanding of the instructions.  A copy of instructions were sent to the patient via MyChart unless otherwise noted below.    The patient was advised to call back or seek an in-person evaluation if the symptoms worsen or if the condition fails to improve as anticipated.  Time:  I spent 8 minutes with the patient via telehealth technology discussing the above problems/concerns.    Margaretann Loveless, PA-C

## 2022-03-02 ENCOUNTER — Telehealth: Payer: Self-pay | Admitting: Urgent Care

## 2022-03-02 DIAGNOSIS — B9689 Other specified bacterial agents as the cause of diseases classified elsewhere: Secondary | ICD-10-CM

## 2022-03-02 DIAGNOSIS — B379 Candidiasis, unspecified: Secondary | ICD-10-CM

## 2022-03-02 DIAGNOSIS — J4520 Mild intermittent asthma, uncomplicated: Secondary | ICD-10-CM

## 2022-03-02 DIAGNOSIS — N760 Acute vaginitis: Secondary | ICD-10-CM

## 2022-03-02 DIAGNOSIS — T3695XA Adverse effect of unspecified systemic antibiotic, initial encounter: Secondary | ICD-10-CM

## 2022-03-02 DIAGNOSIS — Z91199 Patient's noncompliance with other medical treatment and regimen due to unspecified reason: Secondary | ICD-10-CM

## 2022-03-02 MED ORDER — METRONIDAZOLE 500 MG PO TABS: 500.0000 mg | ORAL_TABLET | Freq: Two times a day (BID) | ORAL | 0 refills | Status: AC

## 2022-03-02 MED ORDER — ALBUTEROL SULFATE HFA 108 (90 BASE) MCG/ACT IN AERS: 2.0000 | INHALATION_SPRAY | Freq: Four times a day (QID) | RESPIRATORY_TRACT | 0 refills | Status: DC | PRN

## 2022-03-02 MED ORDER — FLUCONAZOLE 150 MG PO TABS: ORAL_TABLET | ORAL | 0 refills | Status: DC
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
## 2022-03-02 NOTE — Progress Notes (Signed)
Pt no showed to the 6:15 video visit, but rescheduled for 7:00pm

## 2022-03-02 NOTE — Patient Instructions (Signed)
Florestine Avers, thank you for joining Chaney Malling, PA for today's virtual visit.  While this provider is not your primary care provider (PCP), if your PCP is located in our provider database this encounter information will be shared with them immediately following your visit.   [facility] account gives you access to today's visit and all your visits, tests, and labs performed at [facility] " click here if you don't have a [facility] [place] account or go to [email or web address]  Consent: (Patient) April Bean provided verbal consent for this virtual visit at the beginning of the encounter.  Current Medications:  Current Outpatient Medications:    albuterol (VENTOLIN HFA) 108 (90 Base) MCG/ACT inhaler, Inhale 2 puffs into the lungs every 6 (six) hours as needed for wheezing or shortness of breath., Disp: 8 g, Rfl: 0   fluconazole (DIFLUCAN) 150 MG tablet, Take one tab PO at first sign of yeast infection. May repeat in 72 hours if needed, Disp: 2 tablet, Rfl: 0   metroNIDAZOLE (FLAGYL) 500 MG tablet, Take 1 tablet (500 mg total) by mouth 2 (two) times daily for 7 days., Disp: 14 tablet, Rfl: 0   levETIRAcetam (KEPPRA) 500 MG tablet, Take 1 tablet (500 mg total) by mouth 2 (two) times daily., Disp: 60 tablet, Rfl: 0   Medications ordered in this encounter:  Meds ordered this encounter  Medications   metroNIDAZOLE (FLAGYL) 500 MG tablet    Sig: Take 1 tablet (500 mg total) by mouth 2 (two) times daily for 7 days.    Dispense:  14 tablet    Refill:  0    Order Specific Question:   Supervising Provider    Answer:   Bari Mantis   albuterol (VENTOLIN HFA) 108 (90 Base) MCG/ACT inhaler    Sig: Inhale 2 puffs into the lungs every 6 (six) hours as needed for wheezing or shortness of breath.    Dispense:  8 g    Refill:  0    Order Specific Question:   Supervising Provider    Answer:   Chase Picket [[id number]]   fluconazole (DIFLUCAN) 150 MG  tablet    Sig: Take one tab PO at first sign of yeast infection. May repeat in 72 hours if needed    Dispense:  2 tablet    Refill:  0    Order Specific Question:   Supervising Provider    Answer:   Chase Picket [[id number]]     *If you need refills on other medications prior to your next appointment, please contact your pharmacy*  Follow-Up: Call back or seek an in-person evaluation if the symptoms worsen or if the condition fails to improve as anticipated.  [place] [phone]  Other Instructions Please start taking flagyl twice daily until gone, for a total of 14 pills. Do not drink any alcohol while taking this medication. Should you continue to have symptoms, please head to an in person urgent care to have a vaginal swab obtained for testing. If you develop symptoms of a yeast infection (thick white vaginal discharge and itching), you may take the diflucan called in today. Usually one tablet is sufficient, however if you require the second tablet you must wait 72 hours prior to taking. Use the albuterol inhaler two puffs every 4-6 hours as needed for tightness in chest or wheezing associated with your reactive airway disease. Cessation of smoking would also be beneficial.   If you have been  instructed to have an in-person evaluation today at a local Urgent Care facility, please use the link below. It will take you to a list of all of our available [facility], including address, phone number and hours of operation. Please do not delay care.  [facility]  If you or a family member do not have a primary care provider, use the link below to schedule a visit and establish care. When you choose a [facility] primary care physician or advanced practice provider, you gain a long-term partner in health. Find a Primary Care Provider  Learn more about [facility] in-office and virtual care options: [facility]

## 2022-03-13 ENCOUNTER — Ambulatory Visit (INDEPENDENT_AMBULATORY_CARE_PROVIDER_SITE_OTHER): Payer: Commercial Managed Care - HMO

## 2022-03-13 ENCOUNTER — Other Ambulatory Visit: Payer: Self-pay

## 2022-03-13 ENCOUNTER — Encounter (HOSPITAL_COMMUNITY): Payer: Self-pay | Admitting: Emergency Medicine

## 2022-03-13 ENCOUNTER — Ambulatory Visit (HOSPITAL_COMMUNITY)
Admission: EM | Admit: 2022-03-13 | Discharge: 2022-03-13 | Disposition: A | Discharge status: Home / Self Care | Payer: Commercial Managed Care - HMO | Attending: Emergency Medicine | Admitting: Emergency Medicine

## 2022-03-13 DIAGNOSIS — S161XXA Strain of muscle, fascia and tendon at neck level, initial encounter: Secondary | ICD-10-CM

## 2022-03-13 DIAGNOSIS — M545 Low back pain, unspecified: Secondary | ICD-10-CM

## 2022-03-13 MED ORDER — METHOCARBAMOL 500 MG PO TABS: 500.0000 mg | ORAL_TABLET | Freq: Every evening | ORAL | 0 refills | Status: DC

## 2022-03-13 MED ORDER — KETOROLAC TROMETHAMINE 30 MG/ML IJ SOLN
30.0000 mg | Freq: Once | INTRAMUSCULAR | Status: AC
  Administered 2022-03-13: 30 mg via INTRAMUSCULAR

## 2022-03-13 MED ORDER — KETOROLAC TROMETHAMINE 30 MG/ML IJ SOLN
INTRAMUSCULAR | Status: AC
  Filled 2022-03-13: qty 1

## 2022-03-13 MED ORDER — PREDNISONE 20 MG PO TABS: 40.0000 mg | ORAL_TABLET | Freq: Every day | ORAL | 0 refills | Status: DC
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
## 2022-03-13 NOTE — Discharge Instructions (Addendum)
Prednisone has been sent to the pharmacy, you will take 2 tablets each morning for the next 5 days. Robaxin has been sent to the pharmacy, you will take 1 tablet before bedtime.  Do not operate any heavy machinery or drive a car after taking the muscle relaxant.   [facility] has been placed on your discharge paperwork, in the case that the back pain does not improve, I suggest that you follow-up with this office.
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
## 2022-04-18 ENCOUNTER — Encounter (HOSPITAL_COMMUNITY): Payer: Self-pay | Admitting: Emergency Medicine

## 2022-04-18 ENCOUNTER — Ambulatory Visit (HOSPITAL_COMMUNITY)
Admission: EM | Admit: 2022-04-18 | Discharge: 2022-04-18 | Disposition: A | Discharge status: Home / Self Care | Payer: Commercial Managed Care - HMO | Attending: Family Medicine | Admitting: Family Medicine

## 2022-04-18 DIAGNOSIS — R059 Cough, unspecified: Secondary | ICD-10-CM | POA: Diagnosis present

## 2022-04-18 DIAGNOSIS — J069 Acute upper respiratory infection, unspecified: Secondary | ICD-10-CM | POA: Diagnosis not present

## 2022-04-18 DIAGNOSIS — Z1152 Encounter for screening for COVID-19: Secondary | ICD-10-CM | POA: Insufficient documentation

## 2022-04-18 DIAGNOSIS — R519 Headache, unspecified: Secondary | ICD-10-CM | POA: Diagnosis present

## 2022-04-18 MED ORDER — BENZONATATE 100 MG PO CAPS: 100.0000 mg | ORAL_CAPSULE | Freq: Three times a day (TID) | ORAL | 0 refills | Status: AC | PRN

## 2022-04-18 MED ORDER — ALBUTEROL SULFATE HFA 108 (90 BASE) MCG/ACT IN AERS: 2.0000 | INHALATION_SPRAY | RESPIRATORY_TRACT | 0 refills | Status: AC | PRN

## 2022-04-18 NOTE — Discharge Instructions (Signed)
  You have been swabbed for COVID and flu, and the test will result in the next 24 hours. Our staff will call you if positive. If the COVID test is positive, you should quarantine for 5 days from the start of your symptoms  Take benzonatate 100 mg, 1 tab every 8 hours as needed for cough.  Albuterol inhaler--do 2 puffs every 4 hours as needed for shortness of breath or wheezing

## 2022-04-18 NOTE — ED Provider Notes (Signed)
[facility]    CSN: [id number] Arrival date & time: 04/18/22  1511      History   Chief Complaint Chief Complaint  Patient presents with   Chills   Headache   Cough    HPI April Bean is a 31 y.o. female.    Headache Associated symptoms: cough   Cough Associated symptoms: headaches    Here for cough and nasal congestion and rhinorrhea that began yesterday.  She also has had chills and subjective fever.  No nausea vomiting or diarrhea.  She has felt very tired and dizzy.  She also is having generalized myalgia.   Last menstrual cycle was on December 10.    Past Medical History:  Diagnosis Date   Depression     Patient Active Problem List   Diagnosis Date Noted   Women's annual routine gynecological examination 03/07/2021   Screen for STD (sexually transmitted disease) 03/07/2021   Pre-conception counseling 03/07/2021    History reviewed. No pertinent surgical history.  OB History     Gravida  0   Para  0   Term  0   Preterm  0   AB  0   Living  0      SAB  0   IAB  0   Ectopic  0   Multiple  0   Live Births  0            Home Medications    Prior to Admission medications   Medication Sig Start Date End Date Taking? Authorizing Provider  benzonatate (TESSALON) 100 MG capsule Take 1 capsule (100 mg total) by mouth 3 (three) times daily as needed for cough. 04/18/22  Yes Barrett Henle, MD  albuterol (VENTOLIN HFA) 108 (90 Base) MCG/ACT inhaler Inhale 2 puffs into the lungs every 4 (four) hours as needed for wheezing or shortness of breath. 04/18/22   Barrett Henle, MD    Family History Family History  Problem Relation Age of Onset   Diabetes Mother    Hypertension Mother    Healthy Father     Social History Social History   Tobacco Use   Smoking status: Some Days    Packs/day: 0.25    Types: Cigarettes, Cigars   Smokeless tobacco: Current   Tobacco comments:    2-3 cigs/day  Vaping Use    Vaping Use: Former  Substance Use Topics   Alcohol use: Yes    Comment: social   Drug use: Yes    Types: Marijuana     Allergies   Pertussis vaccines   Review of Systems Review of Systems  Respiratory:  Positive for cough.   Neurological:  Positive for headaches.     Physical Exam Triage Vital Signs ED Triage Vitals  Enc Vitals Group     BP 04/18/22 1840 (!) 161/92     Pulse Rate 04/18/22 1840 86     Resp 04/18/22 1840 17     Temp 04/18/22 1840 98.9 F (37.2 C)     Temp Source 04/18/22 1840 Oral     SpO2 04/18/22 1840 97 %     Weight --      Height --      Head Circumference --      Peak Flow --      Pain Score 04/18/22 1839 7     Pain Loc --      Pain Edu? --      Excl. in [place]? --  No data found.  Updated Vital Signs BP (!) 161/92 (BP Location: Right Arm)   Pulse 86   Temp 98.9 F (37.2 C) (Oral)   Resp 17   SpO2 97%   Visual Acuity Right Eye Distance:   Left Eye Distance:   Bilateral Distance:    Right Eye Near:   Left Eye Near:    Bilateral Near:     Physical Exam Vitals reviewed.  Constitutional:      General: She is not in acute distress.    Appearance: She is not ill-appearing, toxic-appearing or diaphoretic.  HENT:     Right Ear: Tympanic membrane and ear canal normal.     Left Ear: Tympanic membrane and ear canal normal.     Nose: Congestion present.     Mouth/Throat:     Mouth: Mucous membranes are moist.     Comments: No erythema but there is clear mucus draining Eyes:     Extraocular Movements: Extraocular movements intact.     Conjunctiva/sclera: Conjunctivae normal.     Pupils: Pupils are equal, round, and reactive to light.  Cardiovascular:     Rate and Rhythm: Normal rate and regular rhythm.     Heart sounds: No murmur heard. Pulmonary:     Effort: Pulmonary effort is normal. No respiratory distress.     Breath sounds: No stridor. No wheezing, rhonchi or rales.  Musculoskeletal:     Cervical back: Neck supple.   Lymphadenopathy:     Cervical: No cervical adenopathy.  Skin:    Capillary Refill: Capillary refill takes less than 2 seconds.     Coloration: Skin is not jaundiced or pale.  Neurological:     General: No focal deficit present.     Mental Status: She is alert and oriented to person, place, and time.  Psychiatric:        Behavior: Behavior normal.      UC Treatments / Results  Labs (all labs ordered are listed, but only abnormal results are displayed) Labs Reviewed  RESP PANEL BY RT-PCR (FLU A&B, COVID) [id number]    EKG   Radiology No results found.  Procedures Procedures (including critical care time)  Medications Ordered in UC Medications - No data to display  Initial Impression / Assessment and Plan / UC Course  I have reviewed the triage vital signs and the nursing notes.  Pertinent labs & imaging results that were available during my care of the patient were reviewed by me and considered in my medical decision making (see chart for details).        She is swabbed for COVID and flu.  If flu is positive she is a candidate for Tamiflu.  If COVID is positive she is a candidate for Paxlovid as her last EGFR was greater than 60. Albuterol is sent in for her asthma in case she needs it.  Tessalon Perles are sent in for the cough  Final Clinical Impressions(s) / UC Diagnoses   Final diagnoses:  Viral upper respiratory tract infection     Discharge Instructions       You have been swabbed for COVID and flu, and the test will result in the next 24 hours. Our staff will call you if positive. If the COVID test is positive, you should quarantine for 5 days from the start of your symptoms  Take benzonatate 100 mg, 1 tab every 8 hours as needed for cough.  Albuterol inhaler--do 2 puffs every 4 hours as needed for shortness of  breath or wheezing        ED Prescriptions     Medication Sig Dispense Auth. Provider   albuterol (VENTOLIN HFA) 108 (90 Base)  MCG/ACT inhaler Inhale 2 puffs into the lungs every 4 (four) hours as needed for wheezing or shortness of breath. 8 g Barrett Henle, MD   benzonatate (TESSALON) 100 MG capsule Take 1 capsule (100 mg total) by mouth 3 (three) times daily as needed for cough. 21 capsule Barrett Henle, MD      PDMP not reviewed this encounter.   Barrett Henle, MD 04/18/22 [phone]

## 2022-04-18 NOTE — ED Triage Notes (Signed)
Pt reports a cough, chills and headache. States just returned from [facility] yesterday and that's when symptoms started.

## 2022-04-19 ENCOUNTER — Telehealth (HOSPITAL_COMMUNITY): Payer: Self-pay | Admitting: Emergency Medicine

## 2022-04-19 LAB — RESP PANEL BY RT-PCR (FLU A&B, COVID) ARPGX2
Influenza A by PCR: NEGATIVE
Influenza B by PCR: POSITIVE — AB
SARS Coronavirus 2 by RT PCR: NEGATIVE

## 2022-04-19 MED ORDER — OSELTAMIVIR PHOSPHATE 75 MG PO CAPS: 75.0000 mg | ORAL_CAPSULE | Freq: Two times a day (BID) | ORAL | 0 refills | Status: AC

## 2022-06-05 ENCOUNTER — Telehealth: Payer: Commercial Managed Care - HMO | Admitting: Urgent Care

## 2022-06-05 DIAGNOSIS — N760 Acute vaginitis: Secondary | ICD-10-CM | POA: Diagnosis not present

## 2022-06-05 MED ORDER — METRONIDAZOLE 500 MG PO TABS: 500.0000 mg | ORAL_TABLET | Freq: Two times a day (BID) | ORAL | 0 refills | Status: AC

## 2022-06-05 MED ORDER — FLUCONAZOLE 150 MG PO TABS: ORAL_TABLET | ORAL | 0 refills | Status: AC

## 2022-06-05 NOTE — Progress Notes (Signed)
E-Visit for Vaginal Symptoms  We are sorry that you are not feeling well. Here is how we plan to help! Based on what you shared with me it looks like you: May have a vaginosis due to bacteria  Vaginosis is an inflammation of the vagina that can result in discharge, itching and pain. The cause is usually a change in the normal balance of vaginal bacteria or an infection. Vaginosis can also result from reduced estrogen levels after menopause.  The most common causes of vaginosis are:   Bacterial vaginosis which results from an overgrowth of one on several organisms that are normally present in your vagina.   Yeast infections which are caused by a naturally occurring fungus called candida.   Vaginal atrophy (atrophic vaginosis) which results from the thinning of the vagina from reduced estrogen levels after menopause.   Trichomoniasis which is caused by a parasite and is commonly transmitted by sexual intercourse.  Factors that increase your risk of developing vaginosis include: Medications, such as antibiotics and steroids Uncontrolled diabetes Use of hygiene products such as bubble bath, vaginal spray or vaginal deodorant Douching Wearing damp or tight-fitting clothing Using an intrauterine device (IUD) for birth control Hormonal changes, such as those associated with pregnancy, birth control pills or menopause Sexual activity Having a sexually transmitted infection  Your treatment plan is Metronidazole or Flagyl 500mg  twice a day for 7 days.  I have electronically sent this prescription into the pharmacy that you have chosen.  I have also sent in the diflucan requested. Please take one tablet today in a single dose. May repeat the second diflucan in 72 hours if symptoms persist.  Be sure to take all of the medication as directed. Stop taking any medication if you develop a rash, tongue swelling or shortness of breath. Mothers who are breast feeding should consider pumping and  discarding their breast milk while on these antibiotics. However, there is no consensus that infant exposure at these doses would be harmful.  Remember that medication creams can weaken latex condoms. Marland Kitchen   HOME CARE:  Good hygiene may prevent some types of vaginosis from recurring and may relieve some symptoms:  Avoid baths, hot tubs and whirlpool spas. Rinse soap from your outer genital area after a shower, and dry the area well to prevent irritation. Don't use scented or harsh soaps, such as those with deodorant or antibacterial action. Avoid irritants. These include scented tampons and pads. Wipe from front to back after using the toilet. Doing so avoids spreading fecal bacteria to your vagina.  Other things that may help prevent vaginosis include:  Don't douche. Your vagina doesn't require cleansing other than normal bathing. Repetitive douching disrupts the normal organisms that reside in the vagina and can actually increase your risk of vaginal infection. Douching won't clear up a vaginal infection. Use a latex condom. Both female and female latex condoms may help you avoid infections spread by sexual contact. Wear cotton underwear. Also wear pantyhose with a cotton crotch. If you feel comfortable without it, skip wearing underwear to bed. Yeast thrives in [employer] Your symptoms should improve in the next day or two.  GET HELP RIGHT AWAY IF:  You have pain in your lower abdomen ( pelvic area or over your ovaries) You develop nausea or vomiting You develop a fever Your discharge changes or worsens You have persistent pain with intercourse You develop shortness of breath, a rapid pulse, or you faint.  These symptoms could be signs of problems  or infections that need to be evaluated by a medical provider now.  MAKE SURE YOU   Understand these instructions. Will watch your condition. Will get help right away if you are not doing well or get worse.  Thank you for choosing  an e-visit.  Your e-visit answers were reviewed by a board certified advanced clinical practitioner to complete your personal care plan. Depending upon the condition, your plan could have included both over the counter or prescription medications.  Please review your pharmacy choice. Make sure the pharmacy is open so you can pick up prescription now. If there is a problem, you may contact your provider through [employer] and have the prescription routed to another pharmacy.  Your safety is important to [place]. If you have drug allergies check your prescription carefully.   For the next 24 hours you can use MyChart to ask questions about today's visit, request a non-urgent call back, or ask for a work or school excuse. You will get an email in the next two days asking about your experience. I hope that your e-visit has been valuable and will speed your recovery.   I have spent 5 minutes in review of e-visit questionnaire, review and updating patient chart, medical decision making and response to patient.   [place], [place]

## 2022-07-12 ENCOUNTER — Other Ambulatory Visit (HOSPITAL_COMMUNITY): Payer: Self-pay

## 2023-10-17 IMAGING — CT CT HEAD W/O CM
4 of 5 series · 15 of 47 positions shown, 17 images · non-contrast
Comparison: CT head the same day.

CLINICAL DATA: Mental status change, unknown cause ?seizure



[Series 3: head without · axial · non-contrast · 0.47mm/px · z∈[-216,-111]mm · 5 of 33 slices shown, 7 images]
[im 6/33  brain]
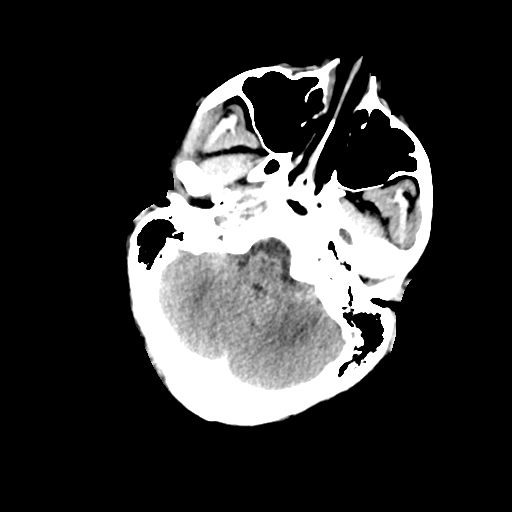
[im 6/33  bone]
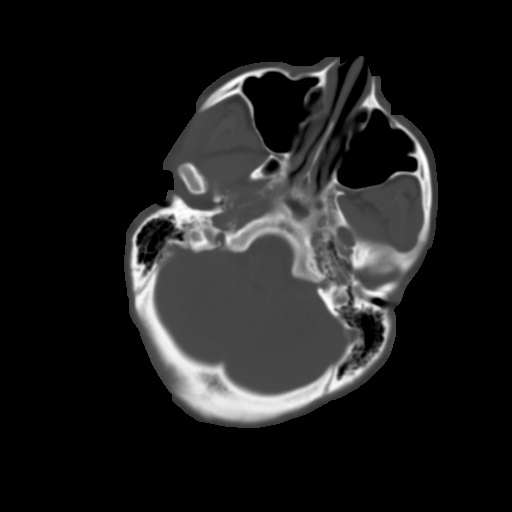
[im 11/33  brain]
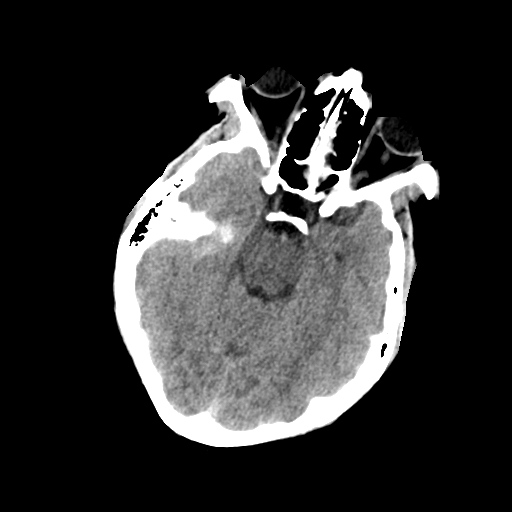
[im 17/33  brain]
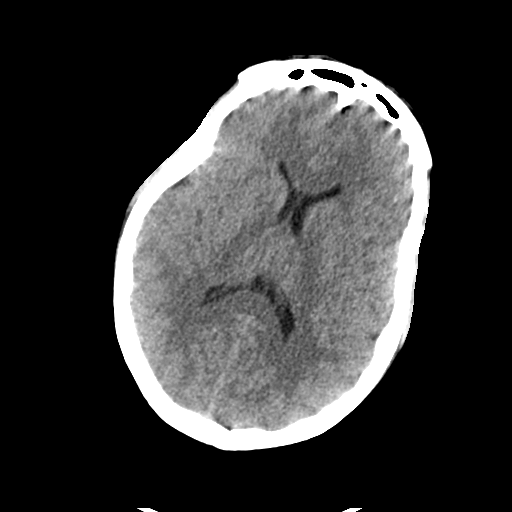
[im 22/33  brain]
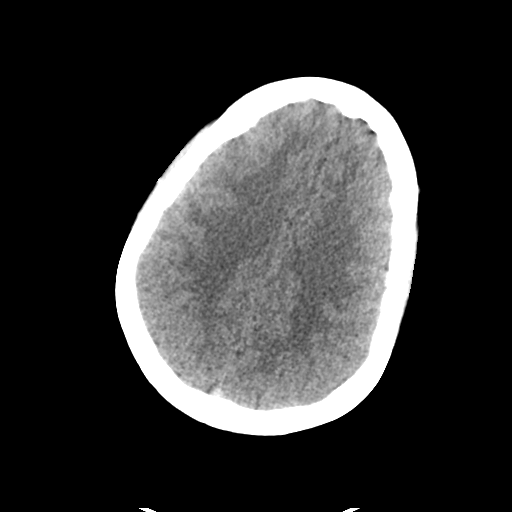
[im 27/33  brain]
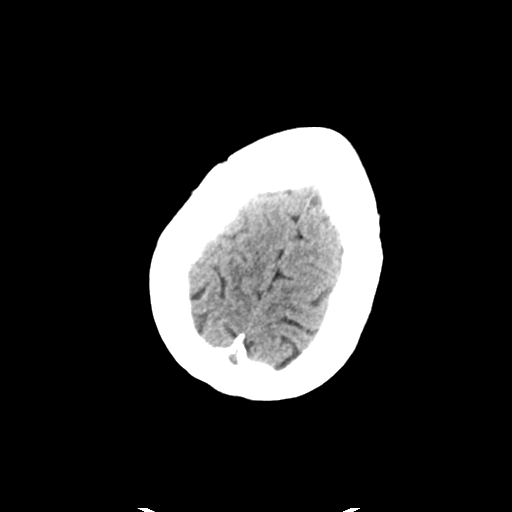
[im 27/33  bone]
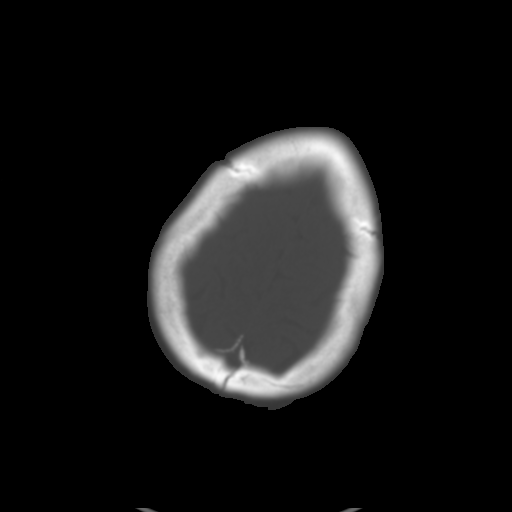

[Series 4: head without cor · coronal · non-contrast · 0.34mm/px · 3 of 75 slices shown]
[im 25/75  brain]
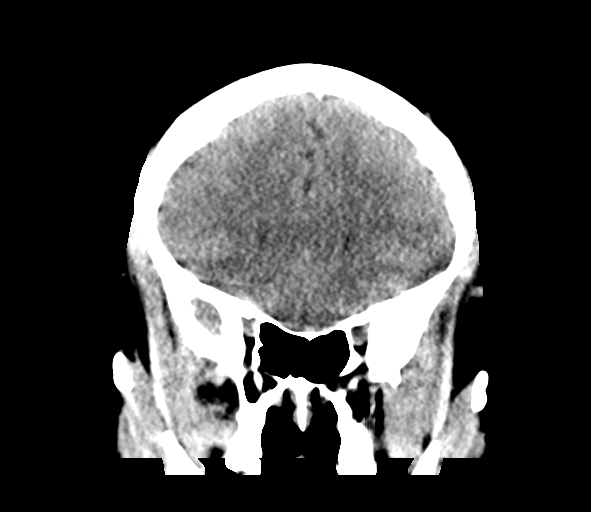
[im 33/75  brain]
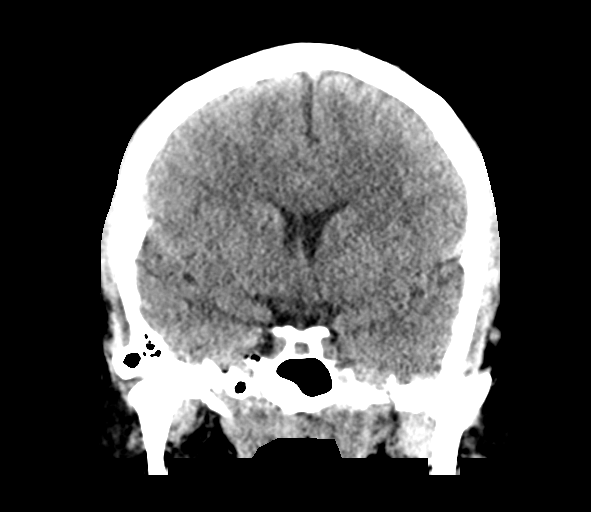
[im 42/75  brain]
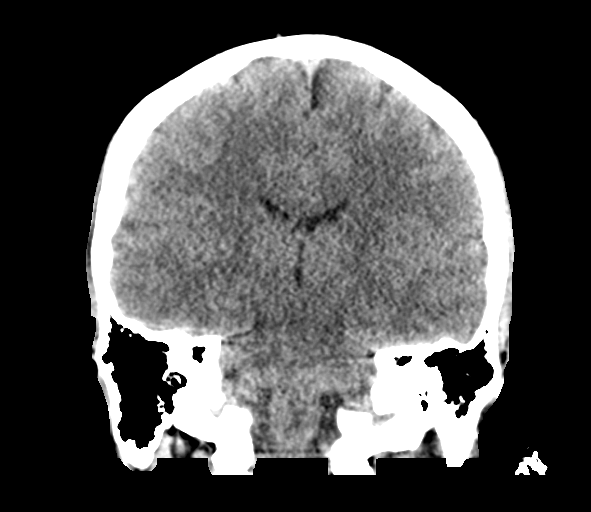

[Series 5: head without sag · sagittal · non-contrast · 0.32mm/px · 3 of 67 slices shown]
[im 23/67  brain]
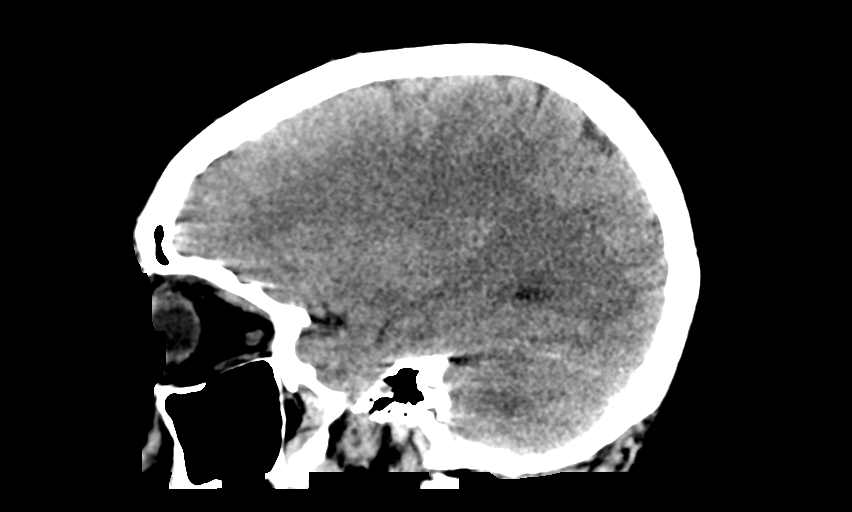
[im 34/67  brain]
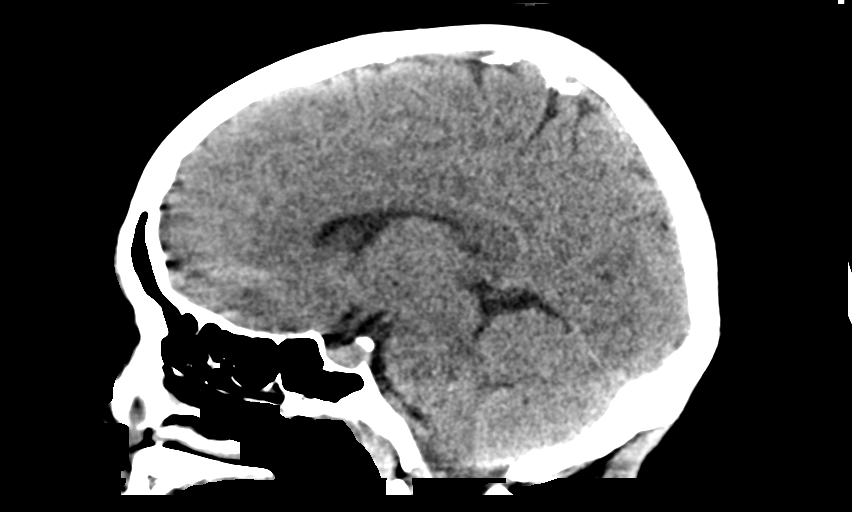
[im 45/67  brain]
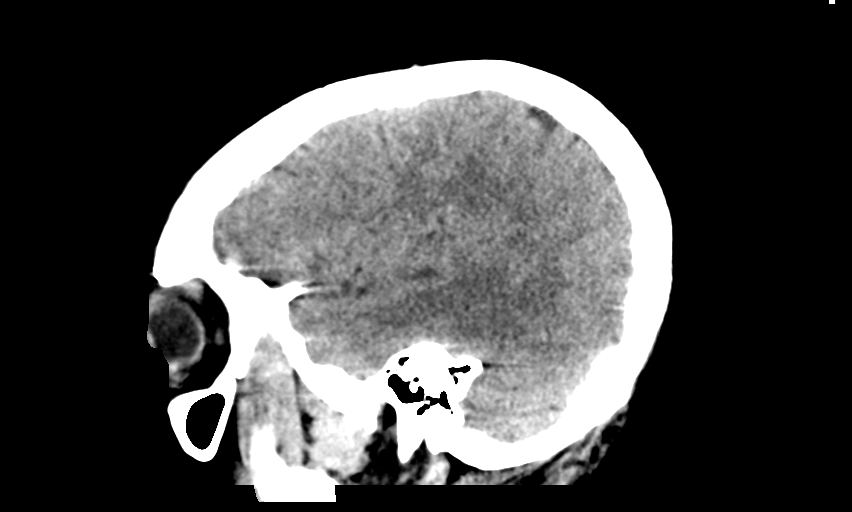

[Series 6: head without ax · axial · non-contrast · 0.47mm/px · z∈[-214,-129]mm · 4 of 34 slices shown]
[im 6/34  brain]
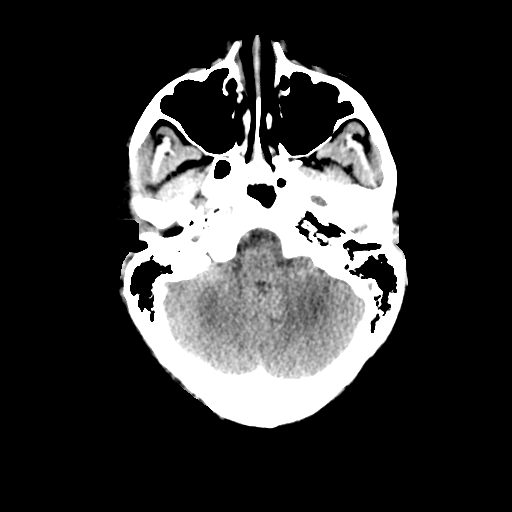
[im 12/34  brain]
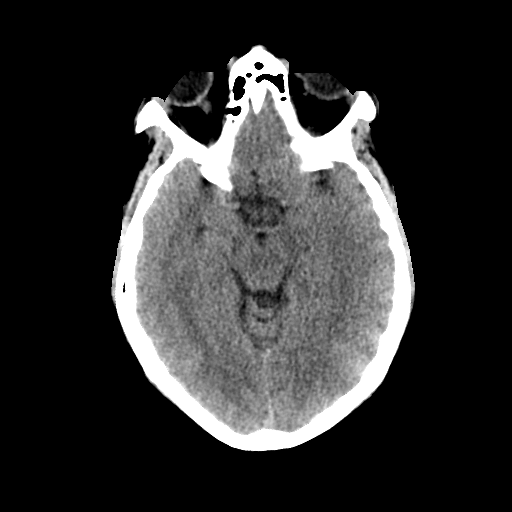
[im 17/34  brain]
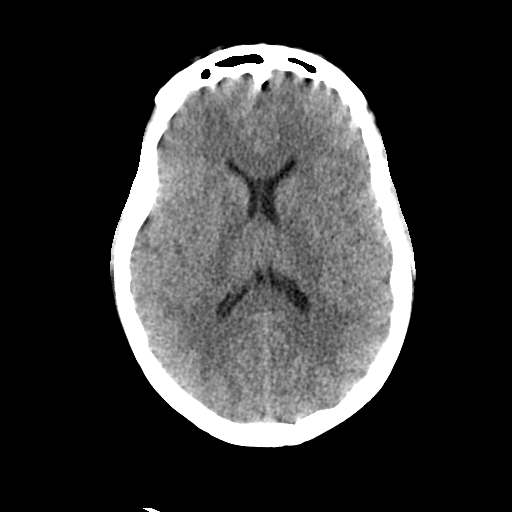
[im 23/34  brain]
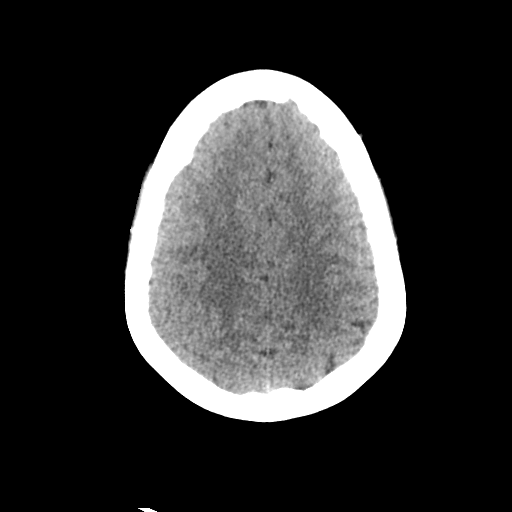

[15 of 47 positions shown; findings below may reference images not displayed]

FINDINGS: Brain: No evidence of acute infarction, hemorrhage, hydrocephalus,
extra-axial collection or mass lesion/mass effect.

Vascular: No evidence vessel identified.

Skull: No acute fracture.

Sinuses/Orbits: Clear sinuses.  No acute orbital findings.

Other: Left larger than right sigmoid sinus diverticulum. No mastoid
effusions.
IMPRESSION: No evidence of acute intracranial abnormality. Seizure protocol MRI
could further evaluate if clinically warranted.
